# Patient Record
Sex: Female | Born: 1935 | Race: White | Hispanic: No | Marital: Married | Smoking: Former smoker
Health system: Southern US, Community
[De-identification: ages and names within clinical notes are randomized; demographics above are authoritative.]

## PROBLEM LIST (undated history)

## (undated) DIAGNOSIS — M51369 Other intervertebral disc degeneration, lumbar region without mention of lumbar back pain or lower extremity pain: Secondary | ICD-10-CM

## (undated) DIAGNOSIS — Z950 Presence of cardiac pacemaker: Secondary | ICD-10-CM

## (undated) DIAGNOSIS — E785 Hyperlipidemia, unspecified: Secondary | ICD-10-CM

## (undated) DIAGNOSIS — J449 Chronic obstructive pulmonary disease, unspecified: Secondary | ICD-10-CM

## (undated) DIAGNOSIS — I739 Peripheral vascular disease, unspecified: Secondary | ICD-10-CM

## (undated) DIAGNOSIS — I219 Acute myocardial infarction, unspecified: Secondary | ICD-10-CM

## (undated) DIAGNOSIS — M549 Dorsalgia, unspecified: Secondary | ICD-10-CM

## (undated) DIAGNOSIS — M179 Osteoarthritis of knee, unspecified: Secondary | ICD-10-CM

## (undated) DIAGNOSIS — M1990 Unspecified osteoarthritis, unspecified site: Secondary | ICD-10-CM

## (undated) DIAGNOSIS — M5136 Other intervertebral disc degeneration, lumbar region: Secondary | ICD-10-CM

## (undated) DIAGNOSIS — I82409 Acute embolism and thrombosis of unspecified deep veins of unspecified lower extremity: Secondary | ICD-10-CM

## (undated) DIAGNOSIS — D649 Anemia, unspecified: Secondary | ICD-10-CM

## (undated) DIAGNOSIS — K922 Gastrointestinal hemorrhage, unspecified: Secondary | ICD-10-CM

## (undated) DIAGNOSIS — M5126 Other intervertebral disc displacement, lumbar region: Secondary | ICD-10-CM

## (undated) DIAGNOSIS — Z7901 Long term (current) use of anticoagulants: Secondary | ICD-10-CM

## (undated) DIAGNOSIS — M1710 Unilateral primary osteoarthritis, unspecified knee: Secondary | ICD-10-CM

## (undated) DIAGNOSIS — I10 Essential (primary) hypertension: Secondary | ICD-10-CM

## (undated) DIAGNOSIS — G8929 Other chronic pain: Secondary | ICD-10-CM

## (undated) DIAGNOSIS — I495 Sick sinus syndrome: Secondary | ICD-10-CM

## (undated) DIAGNOSIS — I4892 Unspecified atrial flutter: Secondary | ICD-10-CM

## (undated) HISTORY — DX: Gastrointestinal hemorrhage, unspecified: K92.2

## (undated) HISTORY — PX: APPENDECTOMY: SHX54

## (undated) HISTORY — DX: Unspecified atrial flutter: I48.92

## (undated) HISTORY — DX: Chronic obstructive pulmonary disease, unspecified: J44.9

## (undated) HISTORY — DX: Osteoarthritis of knee, unspecified: M17.9

## (undated) HISTORY — DX: Unspecified osteoarthritis, unspecified site: M19.90

## (undated) HISTORY — DX: Other intervertebral disc degeneration, lumbar region: M51.36

## (undated) HISTORY — DX: Essential (primary) hypertension: I10

## (undated) HISTORY — DX: Hyperlipidemia, unspecified: E78.5

## (undated) HISTORY — DX: Acute embolism and thrombosis of unspecified deep veins of unspecified lower extremity: I82.409

## (undated) HISTORY — DX: Peripheral vascular disease, unspecified: I73.9

## (undated) HISTORY — PX: LESION REMOVAL: SHX5196

## (undated) HISTORY — DX: Other chronic pain: G89.29

## (undated) HISTORY — DX: Sick sinus syndrome: I49.5

## (undated) HISTORY — PX: SPLENECTOMY: SUR1306

## (undated) HISTORY — DX: Unilateral primary osteoarthritis, unspecified knee: M17.10

## (undated) HISTORY — DX: Acute myocardial infarction, unspecified: I21.9

## (undated) HISTORY — DX: Other intervertebral disc degeneration, lumbar region without mention of lumbar back pain or lower extremity pain: M51.369

## (undated) HISTORY — DX: Presence of cardiac pacemaker: Z95.0

## (undated) HISTORY — DX: Anemia, unspecified: D64.9

## (undated) HISTORY — DX: Long term (current) use of anticoagulants: Z79.01

## (undated) HISTORY — DX: Dorsalgia, unspecified: M54.9

## (undated) HISTORY — PX: LUMBAR DISC SURGERY: SHX700

## (undated) HISTORY — PX: OTHER SURGICAL HISTORY: SHX169

## (undated) HISTORY — DX: Other intervertebral disc displacement, lumbar region: M51.26

---

## 1989-06-02 HISTORY — PX: NASAL SINUS SURGERY: SHX719

## 1998-03-31 ENCOUNTER — Other Ambulatory Visit: Admission: RE | Admit: 1998-03-31 | Discharge: 1998-03-31 | Payer: Self-pay | Admitting: Family Medicine

## 1999-03-24 ENCOUNTER — Other Ambulatory Visit: Admission: RE | Admit: 1999-03-24 | Discharge: 1999-03-24 | Payer: Self-pay | Admitting: Family Medicine

## 2001-05-15 ENCOUNTER — Other Ambulatory Visit: Admission: RE | Admit: 2001-05-15 | Discharge: 2001-05-15 | Payer: Self-pay | Admitting: Family Medicine

## 2002-03-21 ENCOUNTER — Encounter: Payer: Self-pay | Admitting: Orthopaedic Surgery

## 2002-03-21 ENCOUNTER — Ambulatory Visit (HOSPITAL_COMMUNITY): Admission: RE | Admit: 2002-03-21 | Discharge: 2002-03-21 | Payer: Self-pay | Admitting: Orthopaedic Surgery

## 2002-05-20 ENCOUNTER — Other Ambulatory Visit: Admission: RE | Admit: 2002-05-20 | Discharge: 2002-05-20 | Payer: Self-pay | Admitting: Gynecology

## 2002-10-02 HISTORY — PX: CARDIAC CATHETERIZATION: SHX172

## 2003-06-03 ENCOUNTER — Encounter: Payer: Self-pay | Admitting: Nephrology

## 2003-06-03 ENCOUNTER — Encounter: Admission: RE | Admit: 2003-06-03 | Discharge: 2003-06-03 | Payer: Self-pay | Admitting: Nephrology

## 2003-07-08 ENCOUNTER — Encounter: Payer: Self-pay | Admitting: Emergency Medicine

## 2003-07-08 ENCOUNTER — Inpatient Hospital Stay (HOSPITAL_COMMUNITY): Admission: EM | Admit: 2003-07-08 | Discharge: 2003-07-14 | Payer: Self-pay | Admitting: Emergency Medicine

## 2003-07-09 HISTORY — PX: PACEMAKER INSERTION: SHX728

## 2003-07-11 ENCOUNTER — Encounter: Payer: Self-pay | Admitting: Internal Medicine

## 2003-07-12 ENCOUNTER — Encounter: Payer: Self-pay | Admitting: Internal Medicine

## 2003-07-23 ENCOUNTER — Inpatient Hospital Stay (HOSPITAL_COMMUNITY): Admission: AD | Admit: 2003-07-23 | Discharge: 2003-07-27 | Payer: Self-pay | Admitting: Internal Medicine

## 2003-07-23 ENCOUNTER — Encounter: Payer: Self-pay | Admitting: Internal Medicine

## 2003-07-26 ENCOUNTER — Encounter: Payer: Self-pay | Admitting: Internal Medicine

## 2003-07-31 ENCOUNTER — Inpatient Hospital Stay (HOSPITAL_COMMUNITY): Admission: AD | Admit: 2003-07-31 | Discharge: 2003-08-05 | Payer: Self-pay | Admitting: Family Medicine

## 2003-08-06 ENCOUNTER — Encounter: Admission: RE | Admit: 2003-08-06 | Discharge: 2003-08-06 | Payer: Self-pay | Admitting: Family Medicine

## 2003-08-25 ENCOUNTER — Ambulatory Visit (HOSPITAL_COMMUNITY): Admission: RE | Admit: 2003-08-25 | Discharge: 2003-08-25 | Payer: Self-pay | Admitting: Gastroenterology

## 2003-08-25 ENCOUNTER — Encounter (INDEPENDENT_AMBULATORY_CARE_PROVIDER_SITE_OTHER): Payer: Self-pay | Admitting: *Deleted

## 2004-07-01 ENCOUNTER — Other Ambulatory Visit: Admission: RE | Admit: 2004-07-01 | Discharge: 2004-07-01 | Payer: Self-pay | Admitting: Gynecology

## 2004-09-21 ENCOUNTER — Ambulatory Visit: Payer: Self-pay

## 2004-12-20 ENCOUNTER — Ambulatory Visit: Payer: Self-pay | Admitting: Internal Medicine

## 2005-02-07 ENCOUNTER — Ambulatory Visit: Payer: Self-pay | Admitting: Internal Medicine

## 2005-05-17 ENCOUNTER — Ambulatory Visit: Payer: Self-pay | Admitting: Internal Medicine

## 2005-05-31 ENCOUNTER — Encounter: Admission: RE | Admit: 2005-05-31 | Discharge: 2005-05-31 | Payer: Self-pay | Admitting: Orthopedic Surgery

## 2005-06-13 ENCOUNTER — Ambulatory Visit: Payer: Self-pay | Admitting: Internal Medicine

## 2005-07-04 ENCOUNTER — Encounter: Admission: RE | Admit: 2005-07-04 | Discharge: 2005-07-04 | Payer: Self-pay | Admitting: Orthopedic Surgery

## 2005-08-17 ENCOUNTER — Inpatient Hospital Stay (HOSPITAL_COMMUNITY): Admission: RE | Admit: 2005-08-17 | Discharge: 2005-08-20 | Payer: Self-pay | Admitting: Neurosurgery

## 2005-09-08 ENCOUNTER — Ambulatory Visit: Payer: Self-pay | Admitting: Internal Medicine

## 2005-12-07 ENCOUNTER — Ambulatory Visit: Payer: Self-pay | Admitting: Internal Medicine

## 2005-12-25 ENCOUNTER — Encounter: Admission: RE | Admit: 2005-12-25 | Discharge: 2005-12-25 | Payer: Self-pay | Admitting: Orthopedic Surgery

## 2006-02-22 ENCOUNTER — Encounter: Admission: RE | Admit: 2006-02-22 | Discharge: 2006-03-22 | Payer: Self-pay | Admitting: Neurosurgery

## 2006-03-07 ENCOUNTER — Ambulatory Visit: Payer: Self-pay | Admitting: Internal Medicine

## 2006-05-29 ENCOUNTER — Ambulatory Visit (HOSPITAL_COMMUNITY): Admission: RE | Admit: 2006-05-29 | Discharge: 2006-05-29 | Payer: Self-pay | Admitting: Ophthalmology

## 2006-06-11 ENCOUNTER — Ambulatory Visit: Payer: Self-pay | Admitting: Internal Medicine

## 2006-07-24 ENCOUNTER — Ambulatory Visit: Payer: Self-pay | Admitting: Internal Medicine

## 2006-07-27 ENCOUNTER — Other Ambulatory Visit: Admission: RE | Admit: 2006-07-27 | Discharge: 2006-07-27 | Payer: Self-pay | Admitting: Gynecology

## 2006-09-04 ENCOUNTER — Inpatient Hospital Stay (HOSPITAL_COMMUNITY): Admission: RE | Admit: 2006-09-04 | Discharge: 2006-09-09 | Payer: Self-pay | Admitting: Orthopaedic Surgery

## 2006-09-06 ENCOUNTER — Ambulatory Visit: Payer: Self-pay | Admitting: Hospitalist

## 2006-09-18 ENCOUNTER — Encounter: Payer: Self-pay | Admitting: Vascular Surgery

## 2006-09-18 ENCOUNTER — Ambulatory Visit (HOSPITAL_COMMUNITY): Admission: RE | Admit: 2006-09-18 | Discharge: 2006-09-18 | Payer: Self-pay | Admitting: Orthopedic Surgery

## 2006-11-13 ENCOUNTER — Encounter: Admission: RE | Admit: 2006-11-13 | Discharge: 2006-12-06 | Payer: Self-pay | Admitting: Orthopaedic Surgery

## 2006-12-04 ENCOUNTER — Ambulatory Visit: Payer: Self-pay | Admitting: Internal Medicine

## 2007-06-04 ENCOUNTER — Ambulatory Visit: Payer: Self-pay | Admitting: Internal Medicine

## 2007-11-05 ENCOUNTER — Inpatient Hospital Stay (HOSPITAL_COMMUNITY): Admission: EM | Admit: 2007-11-05 | Discharge: 2007-11-07 | Payer: Self-pay | Admitting: Emergency Medicine

## 2007-12-05 ENCOUNTER — Ambulatory Visit: Payer: Self-pay | Admitting: Internal Medicine

## 2007-12-10 ENCOUNTER — Ambulatory Visit (HOSPITAL_COMMUNITY): Admission: RE | Admit: 2007-12-10 | Discharge: 2007-12-10 | Payer: Self-pay | Admitting: Gastroenterology

## 2008-02-18 ENCOUNTER — Ambulatory Visit: Payer: Self-pay | Admitting: Internal Medicine

## 2008-03-24 ENCOUNTER — Ambulatory Visit (HOSPITAL_COMMUNITY): Admission: RE | Admit: 2008-03-24 | Discharge: 2008-03-24 | Payer: Self-pay | Admitting: Ophthalmology

## 2008-05-20 ENCOUNTER — Ambulatory Visit: Payer: Self-pay | Admitting: Internal Medicine

## 2008-08-21 ENCOUNTER — Ambulatory Visit: Payer: Self-pay | Admitting: Internal Medicine

## 2008-11-02 ENCOUNTER — Encounter (INDEPENDENT_AMBULATORY_CARE_PROVIDER_SITE_OTHER): Payer: Self-pay | Admitting: *Deleted

## 2009-01-28 ENCOUNTER — Encounter: Payer: Self-pay | Admitting: Internal Medicine

## 2009-02-09 ENCOUNTER — Ambulatory Visit: Payer: Self-pay | Admitting: Internal Medicine

## 2009-02-09 DIAGNOSIS — I495 Sick sinus syndrome: Secondary | ICD-10-CM | POA: Insufficient documentation

## 2009-02-09 DIAGNOSIS — Z950 Presence of cardiac pacemaker: Secondary | ICD-10-CM | POA: Insufficient documentation

## 2009-02-09 DIAGNOSIS — I10 Essential (primary) hypertension: Secondary | ICD-10-CM | POA: Insufficient documentation

## 2009-02-09 DIAGNOSIS — M549 Dorsalgia, unspecified: Secondary | ICD-10-CM | POA: Insufficient documentation

## 2009-02-09 DIAGNOSIS — I4892 Unspecified atrial flutter: Secondary | ICD-10-CM

## 2009-02-09 DIAGNOSIS — I4891 Unspecified atrial fibrillation: Secondary | ICD-10-CM | POA: Insufficient documentation

## 2009-03-05 ENCOUNTER — Ambulatory Visit (HOSPITAL_COMMUNITY): Admission: RE | Admit: 2009-03-05 | Discharge: 2009-03-05 | Payer: Self-pay | Admitting: Neurosurgery

## 2009-04-23 ENCOUNTER — Encounter: Payer: Self-pay | Admitting: Internal Medicine

## 2009-05-12 ENCOUNTER — Encounter: Payer: Self-pay | Admitting: Internal Medicine

## 2009-05-12 ENCOUNTER — Ambulatory Visit: Payer: Self-pay | Admitting: Internal Medicine

## 2009-05-18 ENCOUNTER — Encounter: Payer: Self-pay | Admitting: Internal Medicine

## 2009-07-27 ENCOUNTER — Encounter: Admission: RE | Admit: 2009-07-27 | Discharge: 2009-07-27 | Payer: Self-pay | Admitting: Orthopaedic Surgery

## 2009-07-30 ENCOUNTER — Encounter: Admission: RE | Admit: 2009-07-30 | Discharge: 2009-07-30 | Payer: Self-pay | Admitting: Orthopaedic Surgery

## 2009-08-05 ENCOUNTER — Encounter: Admission: RE | Admit: 2009-08-05 | Discharge: 2009-09-30 | Payer: Self-pay | Admitting: Orthopaedic Surgery

## 2009-08-11 ENCOUNTER — Ambulatory Visit: Payer: Self-pay | Admitting: Internal Medicine

## 2009-08-12 ENCOUNTER — Encounter: Payer: Self-pay | Admitting: Internal Medicine

## 2009-08-19 ENCOUNTER — Encounter: Payer: Self-pay | Admitting: Internal Medicine

## 2009-10-10 ENCOUNTER — Encounter: Payer: Self-pay | Admitting: Internal Medicine

## 2009-11-11 ENCOUNTER — Ambulatory Visit: Payer: Self-pay | Admitting: Internal Medicine

## 2009-11-26 ENCOUNTER — Encounter: Payer: Self-pay | Admitting: Internal Medicine

## 2010-02-09 ENCOUNTER — Ambulatory Visit: Payer: Self-pay | Admitting: Internal Medicine

## 2010-04-06 ENCOUNTER — Encounter: Payer: Self-pay | Admitting: Internal Medicine

## 2010-04-13 ENCOUNTER — Ambulatory Visit (HOSPITAL_COMMUNITY): Admission: RE | Admit: 2010-04-13 | Discharge: 2010-04-13 | Payer: Self-pay | Admitting: Neurosurgery

## 2010-04-13 ENCOUNTER — Encounter: Payer: Self-pay | Admitting: Internal Medicine

## 2010-05-12 ENCOUNTER — Ambulatory Visit: Payer: Self-pay | Admitting: Internal Medicine

## 2010-06-15 ENCOUNTER — Encounter: Payer: Self-pay | Admitting: Internal Medicine

## 2010-06-21 ENCOUNTER — Encounter: Payer: Self-pay | Admitting: Internal Medicine

## 2010-08-02 ENCOUNTER — Encounter: Admission: RE | Admit: 2010-08-02 | Discharge: 2010-08-02 | Payer: Self-pay | Admitting: Neurosurgery

## 2010-08-09 ENCOUNTER — Encounter: Payer: Self-pay | Admitting: Internal Medicine

## 2010-08-11 ENCOUNTER — Ambulatory Visit: Payer: Self-pay | Admitting: Internal Medicine

## 2010-08-24 ENCOUNTER — Encounter: Payer: Self-pay | Admitting: Internal Medicine

## 2010-10-18 ENCOUNTER — Encounter
Admission: RE | Admit: 2010-10-18 | Discharge: 2010-10-18 | Payer: Self-pay | Source: Home / Self Care | Attending: Neurosurgery | Admitting: Neurosurgery

## 2010-10-28 ENCOUNTER — Encounter: Payer: Self-pay | Admitting: Internal Medicine

## 2010-11-01 NOTE — Cardiovascular Report (Signed)
Summary: Office Visit Remote   Office Visit Remote   Imported By: Roderic Ovens 06/21/2010 13:58:28  _____________________________________________________________________  External Attachment:    Type:   Image     Comment:   External Document

## 2010-11-01 NOTE — Letter (Signed)
Summary: Remote Device Check  [employer], Main Office  [address]   [place], [place] [address]   Phone: [phone]  Fax: [phone]     August 24, 2010 MRN: [id number]   [name] [address] [place], [place]  [address]   Dear [name],   Your remote transmission was recieved and reviewed by your physician.  All diagnostics were within normal limits for you.  __X___Your next transmission is scheduled for:   11-10-10.  Please transmit at any time this day.  If you have a wireless device your transmission will be sent automatically.   Sincerely,  Vella Kohler

## 2010-11-01 NOTE — Cardiovascular Report (Signed)
Summary: Office Visit Remote   Office Visit Remote   Imported By: Roderic Ovens 11/29/2009 10:59:28  _____________________________________________________________________  External Attachment:    Type:   Image     Comment:   External Document

## 2010-11-01 NOTE — Assessment & Plan Note (Signed)
Summary: 1 YR F/U [employer]   Visit Type:  Follow-up Primary Provider:  Rudi Heap, MD   History of Present Illness: [name] returns today for followup.  She is a pleasant woman with PAF, atrial flutter s/p ablation, symptomatic bradycardia, s/p PPM and CAD.  She has had a myelogram and spinal injections.  She denies c/p and sob.  She has rare palpitations.  She notes that her blood pressure is well controlled most of the time.  Current Medications (verified): 1)  Lisinopril 20 Mg Tabs (Lisinopril) .Marland Kitchen.. 1 Tab Once Daily 2)  Verapamil Hcl Cr 240 Mg Cr-Tabs (Verapamil Hcl) .Marland Kitchen.. 1 Tab in Am..1/2 Tab At Bedtime 3)  Crestor 10 Mg Tabs (Rosuvastatin Calcium) .Marland Kitchen.. 1 Tab Once Daily 4)  Zetia 10 Mg Tabs (Ezetimibe) .Marland Kitchen.. 1 Tab Once Daily 5)  Coumadin 3 Mg Tabs (Warfarin Sodium) .... Take As Directed 6)  Calcium 600/vitamin D 600-400 Mg-Unit Tabs (Calcium Carbonate-Vitamin D) .... 2 Tab Once Daily 7)  Multivitamins   Tabs (Multiple Vitamin) .Marland Kitchen.. 1 Tab Once Daily 8)  Fish Oil 1000 Mg Caps (Omega-3 Fatty Acids) .... 2 Caps Daily 9)  Vitamin C 500 Mg Tabs (Ascorbic Acid) .Marland Kitchen.. 1 Tab Once Daily 10)  Aspirin 81 Mg Tbec (Aspirin) .... Take One Tablet By Mouth Daily 11)  Vitamin D 1000 Unit Tabs (Cholecalciferol) .... 2 Tabs Daily 12)  Vitamin B-12 250 Mcg Tabs (Cyanocobalamin) .... Once Daily 13)  Bystolic 5 Mg Tabs (Nebivolol Hcl) .... Take One Tablet By Mouth Once Daily. 14)  Furosemide 20 Mg Tabs (Furosemide) .... Take One Tablet As Needed 15)  Nexium 40 Mg Cpdr (Esomeprazole Magnesium) .Marland Kitchen.. 1 Capsule Every Other Day 16)  Tramadol Hcl 50 Mg Tabs (Tramadol Hcl) .... Uad As Needed  Allergies (verified): No Known Drug Allergies  Past History:  Past Medical History: Last updated: 02/09/2009 ATRIAL FLUTTER, PAROXYSMAL (ICD-427.32) BRADYCARDIA-TACHYCARDIA SYNDROME (ICD-427.81) HYPERTENSION, UNSPECIFIED (ICD-401.9)    Past Surgical History: Last updated: 02/09/2009 Small bowel capsule  endoscopy.  12/10/2007  Colonoscopy.  11/07/2007  Esophagogastroduodenoscopy.  11/06/2007  Hip Arthroplasty-Total.. 09/04/2006 1.  L2 laminectomy. 08/17/2005 2.  L2-3 posterior lumbar interbody fusion with AVS PEEK interbody implants      with VITOSS with bone marrow aspirate and L2-3 posterolateral      arthrodesis with 90D posterior instrumentation and VITOSS with bone      graft with microdissection. Esophagogastroduodenoscopy and biopsy. 08/25/2003  Review of Systems  The patient denies chest pain, syncope, dyspnea on exertion, and peripheral edema.    Vital Signs:  Patient profile:   77 year old female Height:      66 inches Weight:      168 pounds BMI:     27.21 Pulse rate:   77 / minute BP sitting:   150 / 90  (left arm)  Vitals Entered By: [name] CMA (Feb 09, 2010 9:47 AM)  Physical Exam  General:  Well developed, well nourished, in no acute distress. Head:  normocephalic and atraumatic Eyes:  PERRLA/EOM intact; conjunctiva and lids normal. Mouth:  Teeth, gums and palate normal. Oral mucosa normal. Neck:  Neck supple, no JVD. No masses, thyromegaly or abnormal cervical nodes. Chest Wall:  no deformities or breast masses noted.  Well healed PPM incision. Lungs:  Clear bilaterally to auscultation with no wheezes or rhonchi. Heart:  RRR with out murmur, rub, or gallop.  PMI is normal. Abdomen:  Bowel sounds positive; abdomen soft and non-tender without masses, organomegaly, or hernias noted. No  hepatosplenomegaly. Msk:  Back normal, normal gait. Muscle strength and tone normal. Pulses:  pulses normal in all 4 extremities Extremities:  No clubbing or cyanosis. Neurologic:  Alert and oriented x 3.   PPM Specifications Following MD:  Lewayne Bunting, MD     PPM Vendor:  [employer]     PPM Model Number:  [id number]     PPM Serial Number:  [id number] PPM DOI:  07/09/2003      Lead 1    Location: RA     DOI: 07/09/2003     Model #: [id number]     Serial #: [id number]     Status:  active Lead 2    Location: RV     DOI: 07/09/2003     Model #: [id number]     Serial #: [id number]     Status: active  Magnet Response Rate:  BOL 85 ERI  65   Explantation Comments:  Coumadin [facility]link  PPM Follow Up Remote Check?  No Battery Voltage:  2.72 V     Battery Est. Longevity:  2 years     Pacer Dependent:  Yes       PPM Device Measurements Atrium  Impedance: 499 ohms, Threshold: 0.5 V at 0.4 msec Right Ventricle  Amplitude: 11.20 mV, Impedance: 576 ohms, Threshold: 0.5 V at 0.4 msec  Episodes MS Episodes:  4383     Percent Mode Switch:  3.4%     Coumadin:  Yes Ventricular High Rate:  1     Atrial Pacing:  96.2%     Ventricular Pacing:  7.8%  Parameters Mode:  DDDR     Lower Rate Limit:  70     Upper Rate Limit:  125 Paced AV Delay:  250     Sensed AV Delay:  140 Next Remote Date:  05/12/2010     Next Cardiology Appt Due:  01/31/2011 Tech Comments:  No parameter changes.  Longest mode switch 4:55 hours, + coumadin, ventricular rates controlled for the most part.  [facility]link transmissions every 3 months.  ROV 1 year with Dr. [name]. Valerie Harm, LPN  Feb 09, 2010 10:03 AM  MD Comments:  Agree with above.  Impression & Recommendations:  Problem # 1:  ATRIAL FIBRILLATION (ICD-427.31) Her rhythm remains stable.  Continue current meds. Her updated medication list for this problem includes:    Coumadin 3 Mg Tabs (Warfarin sodium) .Marland Kitchen... Take as directed    Aspirin 81 Mg Tbec (Aspirin) .Marland Kitchen... Take one tablet by mouth daily    Bystolic 5 Mg Tabs (Nebivolol hcl) .Marland Kitchen... Take one tablet by mouth once daily.  Problem # 2:  CARDIAC PACEMAKER IN SITU (ICD-V45.01) Her device is working normally.  She is approaching ERI.  Will see her back in one year.  Problem # 3:  HYPERTENSION, UNSPECIFIED (ICD-401.9) Her blood pressure is elevated. She will continue her current meds and a low sodium diet.  She notes that at home her blood pressure is better controlled. Her updated medication list for  this problem includes:    Lisinopril 20 Mg Tabs (Lisinopril) .Marland Kitchen... 1 tab once daily    Verapamil Hcl Cr 240 Mg Cr-tabs (Verapamil hcl) .Marland Kitchen... 1 tab in am..1/2 tab at bedtime    Aspirin 81 Mg Tbec (Aspirin) .Marland Kitchen... Take one tablet by mouth daily    Bystolic 5 Mg Tabs (Nebivolol hcl) .Marland Kitchen... Take one tablet by mouth once daily.    Furosemide 20 Mg Tabs (Furosemide) .Marland Kitchen... Take one tablet as needed  Patient Instructions:  1)  Your physician recommends that you schedule a follow-up appointment in: 12 months with Dr [name]

## 2010-11-01 NOTE — Letter (Signed)
Summary: Remote Device Check  [employer], Main Office  [address]   [place], [place] [address]   Phone: [phone]  Fax: [phone]     November 26, 2009 MRN: [id number]   JENELLE DRENNON [address] [place], [place]  [address]   Dear Ms. [facility],   Your remote transmission was recieved and reviewed by your physician.  All diagnostics were within normal limits for you.    __X____Your next office visit is scheduled for:  MAY 2011 WITH DR [name]. Please call our office to schedule an appointment.    Sincerely,  Proofreader

## 2010-11-01 NOTE — Letter (Signed)
Summary: [facility]  [facility]   Imported By: Marylou Mccoy 05/17/2010 10:19:28  _____________________________________________________________________  External Attachment:    Type:   Image     Comment:   External Document

## 2010-11-01 NOTE — Letter (Signed)
Summary: [facility]  [facility]   Imported By: Marylou Mccoy 09/02/2010 14:33:26  _____________________________________________________________________  External Attachment:    Type:   Image     Comment:   External Document

## 2010-11-01 NOTE — Letter (Signed)
Summary: Remote Device Check  [employer], Main Office  [address]   [place], [place] [address]   Phone: [phone]  Fax: [phone]     June 15, 2010 MRN: [id number]   [name] [address] [place], [place]  [address]   Dear Ms. [facility],   Your remote transmission was recieved and reviewed by your physician.  All diagnostics were within normal limits for you.  __X___Your next transmission is scheduled for:   08-11-10.  Please transmit at any time this day.  If you have a wireless device your transmission will be sent automatically.   Sincerely,  Vella Kohler

## 2010-11-01 NOTE — Cardiovascular Report (Signed)
Summary: Office Visit Remote   Office Visit Remote   Imported By: Roderic Ovens 09/05/2010 14:50:59  _____________________________________________________________________  External Attachment:    Type:   Image     Comment:   External Document

## 2010-11-01 NOTE — Letter (Signed)
Summary: [facility] Office Visit Note   [facility] Office Visit Note   Imported By: Roderic Ovens 05/13/2010 14:44:38  _____________________________________________________________________  External Attachment:    Type:   Image     Comment:   External Document

## 2010-11-03 NOTE — Letter (Signed)
Summary: [facility]   [facility]   Imported By: Roderic Ovens 10/06/2010 15:42:05  _____________________________________________________________________  External Attachment:    Type:   Image     Comment:   External Document

## 2010-11-10 ENCOUNTER — Encounter (INDEPENDENT_AMBULATORY_CARE_PROVIDER_SITE_OTHER): Payer: Medicare Other

## 2010-11-10 ENCOUNTER — Encounter: Payer: Self-pay | Admitting: Internal Medicine

## 2010-11-10 DIAGNOSIS — I495 Sick sinus syndrome: Secondary | ICD-10-CM

## 2010-11-29 NOTE — Cardiovascular Report (Signed)
Summary: Office Visit   Office Visit   Imported By: Roderic Ovens 11/23/2010 15:49:12  _____________________________________________________________________  External Attachment:    Type:   Image     Comment:   External Document

## 2010-12-13 NOTE — Letter (Signed)
Summary: [facility] Office Visit Note   [facility] Office Visit Note   Imported By: Roderic Ovens 12/07/2010 11:21:32  _____________________________________________________________________  External Attachment:    Type:   Image     Comment:   External Document

## 2010-12-18 LAB — PROTIME-INR
INR: 1.09 (ref 0.00–1.49)
Prothrombin Time: 14 seconds (ref 11.6–15.2)

## 2011-02-13 ENCOUNTER — Encounter: Payer: Self-pay | Admitting: Internal Medicine

## 2011-02-13 ENCOUNTER — Encounter: Payer: Self-pay | Admitting: *Deleted
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
## 2011-02-14 NOTE — Assessment & Plan Note (Signed)
[facility]                         ELECTROPHYSIOLOGY OFFICE NOTE   [name], [name]                     MRN:          [id number]  DATE:02/18/2008                            DOB:          [birth year]    [name] returns today for followup.  She is a very pleasant 77-year-  old woman with a history of symptomatic tachy-brady syndrome who had had  atrial flutter with 1: 1 AV conduction in the past, and underwent  catheter ablation.  She also had severe bradycardia, and underwent  pacemaker insertion.  She returns, today, for followup.  She has been  stable, but recently tripped while she was walking around outside and  fell and landed on her arm.  There are no broken bones, but she has very  large hematoma on the lateral aspect of left lower arm.  There is a  resulting ecchymotic area throughout the left arm.  The patient had no  specific complaints.  She denies fevers or chills.  She denies any loss  of range of motion in her hand or arm.   PHYSICAL EXAMINATION:  On physical exam the patient is a pleasant 77-  year-old woman in no acute distress.  The blood pressure was 140/80, the pulse was 82 and regular,  respirations were 18.  Weight was 164 pounds  NECK:  Revealed no jugular distention.  LUNGS:  Clear bilaterally to auscultation.  No wheezes, rales, or  rhonchi are present.  CARDIOVASCULAR EXAM:  Revealed a regular rate and rhythm with a normal  S1 and S2.  EXTREMITIES:  Demonstrated no edema.  The left forearm has a 3 x 8 cm  ecchymotic area.  There is no drainage.   INTERROGATION OF THE PACEMAKER DEMONSTRATES:  A Medtronic Kappa 900.  P  and R waves were 2 and 11 respectively.  The impedance 500 in the A and  600 in the V.  The threshold 0.5 at 0.4 in the right atrium, and 0.75 at  0.4 in the right ventricle.  The battery voltage was 2.76 volts.  There  was 119 mode switches less than 0.1% of the time.  The longest of which  was  approximately 30 minutes.  She was 99% A-paced and 1% V-paced.   IMPRESSION:  1. Symptomatic tachy-brady syndrome.  2. Status post pacemaker insertion.  3. Paroxysmal atrial flutter status post ablation.   DISCUSSION:  Overall [name] is stable.  She did have a recent  fall, but appears to be on the healing side of things.  I will plan to  see her back in the office in 1 year, sooner should she have other  problems.  I did tell her that if her falls continue we will have to  stop her Coumadin.  This is one isolated event.     [name]. [name], MD  Electronically Signed    GWT/MedQ  DD: 02/18/2008  DT: 02/18/2008  Job #: [id number]
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
## 2011-02-14 NOTE — Assessment & Plan Note (Signed)
[facility]                         ELECTROPHYSIOLOGY OFFICE NOTE   CATHYE, [name]                     MRN:          [id number]  DATE:06/04/2007                            DOB:          [birth year]    [name] returns today for followup.  She is a very pleasant woman  with a history of symptomatic bradycardia and tachybrady syndrome who is  status post pacemaker insertion.  She denies chest pain.  She denies  shortness of breath.  Overall, she feels well.  She notes that she had  had hip surgery back in December with a hip replacement and has done  well with this.   EXAM:  .  She is a pleasant, well-appearing woman in no distress.  Blood pressure 114/76, the pulse 88 and regular.  Respirations were 18,  and the weight was 165 pounds.  NECK:  No jugular venous distention.  LUNGS:  Clear bilaterally to auscultation.  No wheezes, rales, or  rhonchi were present.  CARDIOVASCULAR:  Regular rate and rhythm with normal S1 and S2.  EXTREMITIES:  No edema.   Interrogation of her pacemaker, Kappa 900, with P and R waves of 2 and  11, respectively.  The impedance 492 in the atrium, 562 in the ventricle  with threshold of 0.5 and 0.4 in both the right atrium and the right  ventricle.  Battery voltage 2.77 V.  She had 347 mode switches, the  longest of which was 2 hours.  Her lower rate was set at 70.  She was  97% A-paced, less than 3% V-paced.   IMPRESSION:  1. Symptomatic bradycardia.  2. Paroxysmal atrial fibrillation.  3. Coumadin therapy.  4. Status post pacemaker.   DISCUSSION:  Overall, [name] is stable.  Her pacemaker is working  normally, and we will plan to see her back for a pacemaker check in a  year.  She will continue on her present medical therapy which includes  Fosamax, Nexium, Coumadin, aspirin, Verapamil, Crestor, Lisinopril, and  multiple vitamins.     [name]. [name], MD  Electronically Signed    GWT/MedQ   DD: 06/04/2007  DT: 06/04/2007  Job #: [id number]   cc:   Lindaann Pascal

## 2011-02-14 NOTE — Op Note (Signed)
NAMEELORA, WOLTER NO.:  [id number]   MEDICAL RECORD NO.:  [id number]          PATIENT TYPE:  INP   LOCATION:  5511                         FACILITY:  [facility]   PHYSICIAN:  James L. Malon Kindle., M.D.DATE OF BIRTH:  November 29, 1930   DATE OF PROCEDURE:  11/06/2007  DATE OF DISCHARGE:                               OPERATIVE REPORT   PROCEDURE PERFORMED:  Esophagogastroduodenoscopy.   MEDICATIONS:  Cetacaine spray, fentanyl 50 mcg, Versed 6 mg IV.   ENDOSCOPIST:  Llana Aliment. Randa Evens, M.D.   INDICATIONS FOR PROCEDURE:  The patient had known history of coronary  disease and atrial fibrillation, has been on Coumadin, had endoscopic  evaluation five years ago in 2004. Her  INR was elevated.  She came in  with positive stool and a hemoglobin of 7 without gross bleeding.   DESCRIPTION OF PROCEDURE:  The procedure had been explained to the  patient and consent obtained.  With the patient in left lateral  decubitus position, the Pentax upper scope was inserted and advanced.  The stomach was entered.  The pylorus identified and passed.  The  duodenum including the bulb and second portion were seen well and were  unremarkable.  The pyloric channel, antrum and body of the stomach were  seen well and were all fine with no abnormalities.  The patient did have  a small hiatal hernia.  The distal and proximal esophagus were  endoscopically normal.  In short there were no signs of active or recent  upper GI bleeding.  The patient tolerated the procedure well.   ASSESSMENT:  Gastrointestinal bleeding with no upper gastrointestinal  source found.   PLAN:  It has been five years since patient had a colonoscopy and will  discuss with her proceeding with a colonoscopy tomorrow.           ______________________________  Llana Aliment Malon Kindle., M.D.     Waldron Session  D:  11/06/2007  T:  11/07/2007  Job:  [id number]   cc:   Ernestina Penna, M.D.  [name]. [name], MD

## 2011-02-14 NOTE — Op Note (Signed)
NAMEWYNNE, ROZAK NO.:  [id number]   MEDICAL RECORD NO.:  [id number]          PATIENT TYPE:  INP   LOCATION:  5511                         FACILITY:  [facility]   PHYSICIAN:  James L. Malon Kindle., M.D.DATE OF BIRTH:  [birth year]   DATE OF PROCEDURE:  11/07/2007  DATE OF DISCHARGE:                               OPERATIVE REPORT   PROCEDURE:  Colonoscopy.   MEDICATIONS:  Fentanyl 100 mcg and Versed 10 mg IV.   INDICATIONS:  Subacute GI bleed with a drop of hemoglobin in a woman who  is on Coumadin and her protime was markedly elevated. Upper endoscopy  was negative.   DESCRIPTION OF PROCEDURE:  The procedure was explained to the patient  and consent obtained.  With the patient in the left lateral decubitus  position, the pediatric adjustable scope was inserted and advanced.  The  prep was excellent.  We reached the cecum without difficulty.  There  were no signs of active bleeding, whatsoever.  The ileocecal valve and  appendiceal orifice were seen.  The scope was withdrawn and the cecum,  ascending colon, transverse colon, descending and sigmoid colon were  seen well.  A few scattered tics in the descending colon, very small.  There was no coffee ground material, clots, or any other abnormalities.  The rectum was seen well on the forward and retroflex view and was  normal.  The scope was withdrawn.  The patient tolerated the procedure  well.   ASSESSMENT:  GI bleeding with no clear source seen.   PLAN:  Would resume Coumadin at the regular dose, have her follow up  with Dr. Kathi Der office up in [place].  I would like to see her back in  the office in three weeks.  We will recheck her stool.  If it is  positive, will consider a capsule endoscopy.           ______________________________  Llana Aliment. Malon Kindle., M.D.     Waldron Session  D:  11/07/2007  T:  11/07/2007  Job:  [id number]   cc:   Ernestina Penna, M.D.

## 2011-02-14 NOTE — Op Note (Signed)
NAMESKILA, ROLLINS NO.:  [id number]   MEDICAL RECORD NO.:  [id number]          PATIENT TYPE:  AMB   LOCATION:  ENDO                         FAC[facility]   PHYSICIAN:  James L. Malon Kindle., M.D.DATE OF BIRTH:  September 24, 1931   DATE OF PROCEDURE:  12/10/2007  DATE OF DISCHARGE:  12/10/2007                               OPERATIVE REPORT   PROCEDURE:  Small bowel capsule endoscopy.   INDICATIONS FOR PROCEDURE:  The patient has had a hemoglobin of 7, INR  has gone up to 4.  She had a colonoscopy showing diverticuli and had no  further bleeding.  Upper endoscopy was normal.  She is chronically on  Coumadin due to atrial fibrillation.  History of DVTs.  This is done to  rule out a lesion of the small bowel.   DESCRIPTION OF PROCEDURE:  The patient was given a Givens capsule in the  usual manner.   RESULTS:  The capsule passed in six hours.  The small bowel passage time  was 2 hours 20 minutes.  The following results were obtained.   The small bowel was essentially free of lesions.  We did not see any  obvious bleeding sites in the small bowel.  Examination of the colon did  reveal a very small AVM in the colon.  The location in the colon was  indeterminate.  Other AVMs could have been present in the colon, but due  to the stool were not seen.   ASSESSMENT:  1. No gross lesions in the small bowel.  2. 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   PLAN:  We will follow the patient clinically and continue to support her  with blood and iron, etc.           ______________________________  Llana Aliment. Malon Kindle., M.D.     Waldron Session  D:  12/19/2007  T:  12/19/2007  Job:  [id number]   cc:   Ernestina Penna, M.D.  Fax: [phone]   [name]. [name], MD  [address]  [address]  [place]  [place] [address]

## 2011-02-14 NOTE — H&P (Signed)
[name], ROW NO.:  [id number]   MEDICAL RECORD NO.:  [id number]          PATIENT TYPE:  INP   LOCATION:  5511                         FACILITY:  [facility]   PHYSICIAN:  Della Goo, M.D. DATE OF BIRTH:  16-Jul-1931   DATE OF ADMISSION:  11/05/2007  DATE OF DISCHARGE:                              HISTORY & PHYSICAL   This is an unassigned patient.   CHIEF COMPLAINT:  Shortness of breath, fatigue.   HISTORY OF PRESENT ILLNESS:  This is a 77 year old female who presents  to the emergency department after being seen by her primary care  physician where she presented secondary to complaints of shortness of  breath and fatigue for 1 month.  She also was told by her husband that  she has had increasing pallor, and he advised her to go to her primary  care physician because she probably was anemic.  The patient saw her  primary care physician in the afternoon prior to admission and was  evaluated and found to have an elevated Coumadin level at 4.0.  She was  also found to be heme-positive on examination and was advised to go to  the emergency department.  Before leaving, the patient was given and a  half a tablet of vitamin K at her physician's office orally.  The  patient came to [facility] because she sees Dr. Randa Evens,  gastroenterology, and has seen him in the past for similar problems.  They arrived in the emergency department.   PAST MEDICAL HISTORY:  1. Coronary artery disease.  2. Pacemaker.  3. Paroxysmal atrial fibrillation.  4. Hyperlipidemia.  5. Prior PTCA with two stents placed.  6. History of left hip replacement.  7. History of two previous back surgeries which were disk fusions.  8. The patient denies have seen any gross hematochezia or melena in      her stools, and she denies having any nausea, vomiting. or      diarrhea.   MEDICATIONS AT THIS TIME:  Celexa, Coumadin, verapamil, Crestor,  Fosamax, lisinopril, multivitamin, Nexium,  Niaspan, and Darvocet-N 100.   SOCIAL HISTORY:  The patient is married, nonsmoker, nondrinker.   FAMILY HISTORY:  Noncontributory.   PHYSICAL EXAMINATION:  GENERAL:  This is a pleasant 77 year old well-  nourished, well-developed female in no discomfort or acute distress.  VITAL SIGNS:  Temperature is 97.9, blood pressure 146/79, heart rate 76,  respirations 20, O2 saturations 100%.  HEENT:  Normocephalic, atraumatic.  Skin does have pallor, so do her  conjunctivae.  Pupils are equally round and reactive to light.  Extraocular muscles are intact.  There is no scleral icterus.  Oropharynx is clear.  NECK:  Supple, full range of motion.  No thyromegaly, adenopathy, or  jugular venous distention.  CARDIOVASCULAR:  Regular rate and rhythm.  No murmurs, gallops, or rubs.  LUNGS:  Clear to auscultation bilaterally.  ABDOMEN:  Positive bowel sounds, soft, nontender, nondistended.  EXTREMITIES:  Without cyanosis, clubbing, or edema.  NEUROLOGIC:  Nonfocal.   LABORATORY STUDIES:  White blood cell count 9.1, hemoglobin 7.9,  hematocrit 25.6, platelets 373, neutrophils 43%  lymphocytes 45%, MCV  74.6.  Pro time is 33.6 and INR is 3.2.  Sodium is 139, potassium 4.5,  chloride 105, carbon dioxide 25, BUN 16, creatinine 1.20, and glucose  104.   ASSESSMENT:  57. A 77 year old female being admitted with occult rectal bleeding      which is chronic versus acute.  2. Anemia.  3. Coagulopathy secondary to Coumadin therapy.  4. Coronary artery disease.  5. Fatigue.   PLAN:  The patient will be admitted to a telemetry area for monitoring.  The patient will be transfused packed red blood cells, and 1 unit of FFP  will be given.  She will be placed on H&H checks and bowel rest with  clear liquids.  IV fluids have also been ordered for maintenance and  hydration.  The patient will continue on her regular medications, except  the Celebrex, aspirin, multivitamin, and B6 will be held at this time.  IV  Protonix therapy has been ordered, and GI prophylaxis of TED hose has  been ordered.  Gastroenterology will be consulted.      Della Goo, M.D.  Electronically Signed     HJ/MEDQ  D:  11/06/2007  T:  11/07/2007  Job:  [id number]

## 2011-02-16 ENCOUNTER — Encounter: Payer: Self-pay | Admitting: Family Medicine

## 2011-02-17 ENCOUNTER — Ambulatory Visit (INDEPENDENT_AMBULATORY_CARE_PROVIDER_SITE_OTHER): Payer: Medicare Other | Admitting: Internal Medicine

## 2011-02-17 ENCOUNTER — Encounter: Payer: Self-pay | Admitting: Internal Medicine

## 2011-02-17 DIAGNOSIS — I4892 Unspecified atrial flutter: Secondary | ICD-10-CM

## 2011-02-17 DIAGNOSIS — I10 Essential (primary) hypertension: Secondary | ICD-10-CM

## 2011-02-17 DIAGNOSIS — I4891 Unspecified atrial fibrillation: Secondary | ICD-10-CM

## 2011-02-17 DIAGNOSIS — Z950 Presence of cardiac pacemaker: Secondary | ICD-10-CM

## 2011-02-17 DIAGNOSIS — I459 Conduction disorder, unspecified: Secondary | ICD-10-CM

## 2011-02-17 NOTE — Discharge Summary (Signed)
NAMEWESSIE, SHANKS NO.:  [id number]   MEDICAL RECORD NO.:  [id number]            PATIENT TYPE:   LOCATION:                                 FACILITY:   PHYSICIAN:  Claude Manges. Cleophas Dunker, M.D.    DATE OF BIRTH:   DATE OF ADMISSION:  09/04/2006  DATE OF DISCHARGE:  09/09/2006                               DISCHARGE SUMMARY   ADMISSION DIAGNOSES:  1. End-stage osteoarthritis left hip.  2. Coronary artery disease with history of stent placement.  3. Hypertension.  4. Pacemaker.  5. On chronic Coumadin due to atrial fibrillation.  6. History of upper extremity deep vein thrombosis.  7. History of myocardial infarction.   DISCHARGE DIAGNOSES:  1. End-stage osteoarthritis left hip status post left total hip      arthroplasty.  2. Acute blood loss anemia secondary to surgery.  3. Urinary tract infection with resulting leukocytosis. Leukocytosis      now resolved.  4. Hypotension now resolved.  5. Acute renal insufficiency now resolved.  6. Hematoma left hip.  7. Coronary artery disease with history of stent placement.  8. Chronic Coumadin secondary to atrial fibrillation.  9. Hypertension.  10.Pacemaker.  11.History of upper extremity deep venous thrombosis.  12.History of myocardial infarction.   SURGICAL PROCEDURES:  On September 04, 2006 Ms. Canupp underwent a left  total hip arthroplasty by Dr. Claude Manges.  [place] assisted by Dr. Rinaldo Ratel and Jacqualine Code, P.A.-C.   COMPLICATIONS:  None.   She had a Pinnacle 100 series acetabular cup size 52 mm with a Pinnacle  marathon acetabular liner lipped 52 mm outer diameter, 32 mm inner  diameter. A prior GE hip stem with reduction groove 15 mm diameter large  stature left with an apex hole eliminator and Articul/EZE femoral head  32 mm, +5 neck length, 12/14 tone.   CONSULTANTS:  1. Pharmacy consult for Coumadin therapy September 04, 2006.  2. Case management and internal medicine teaching service B  consult      September 06, 2006.  3. Physical therapy consult.   HISTORY OF PRESENT ILLNESS:  This 77 year old white female patient  presented to Dr. Cleophas Dunker with history of left hip pain for the last  year. Pain is present with any movement or ambulation, and it does seem  to be located in the groin. Awakens her at night. The hip seems to give  way at times and has caused her to fall.  X-rays show end-stage  arthritic changes, and she has failed conservative treatment. Because of  this she is presenting for a left hip replacement.   HOSPITAL COURSE:  Ms. Tantillo tolerated her surgical procedure well  without immediate postoperative complications. She was transferred to  5000. Postop day #1 T max was 99, pulse 74, BP 107/59.  Hemoglobin 10,  hematocrit 30. Leg was neurovascularly intact. She had been given 2  units of blood intraoperatively due to low H&H, and that seemed to be  fine at this moment. She was started on therapy per protocol. She did  have difficulty with blood pressure during the  night, and the next day  BP was 122/62, pulse 70. Hemoglobin was 9.3, white count elevated at  12.5. Creatinine slightly elevated at 1.6. Urinalysis preoperatively  showed strep achalasia, and she was started on ampicillin for that. She  was noted to have a hematoma in the area of the left hip, and that was  monitored. Because of her hypotension during the night and poor urinary  output, an internal medicine teaching service B consult was obtained and  they followed her the rest of her hospitalization.   Postop day #3 she was feeling better. BP had improved to 111/63.  Hemoglobin 10.1, hematocrit 32.2. Creatinine was stable. She had a  little bit of vascular congestion on chest x-ray. Urine output had  improved. Lovenox was DC'd, and a K pad was applied to that left hip due  to the hematoma and she was continued on therapy.   Postop day #4, December 8, she is doing well. Her renal function  had  improved back to baseline. Urine output was good. BP had improved to  154/83, T-max 99.4. Hemoglobin stable at 10.1,  hematocrit 30.3. It was  felt she was doing well. Foley was DC'd. Continued on therapy, and plans  were made for DC home the next day if she did well. She did do well. No  problems, remained afebrile, and was DC'd home on December 9.   DISCHARGE INSTRUCTIONS:  Diet:  She can resume her regular  prehospitalization diet.   MEDICATIONS:  She may resume her home meds except no Darvocet while on  Vicodin. Her home meds included:   1. Verapamil 240 mg p.o. q.a.m., half tablet p.o. q.p.m..  2. Lisinopril 20 mg p.o. q.a.m.  3. Celebrex 200 mg p.o. q.a.m.  4. Nexium 40 mg p.o. q.a.m.  5. Coumadin dose to be adjusted by the home health pharmacy and fell      back to her baseline which was 3 mg q. Sunday, Tuesday, Thursday,      and Saturday and 1.5 mg q. Monday, Wednesday, Friday.  6. Calcium 600 mg p.o. b.i.d..  7. Darvocet-N 100 one p.o. q.4-6 hours p.r.n. for pain; not to take      while on Vicodin.  8. Crestor 10 mg p.o. q.h.s.  9. Niaspan 500 mg p.o. q.h.s.  10.Fosamax 70 mg p.o. q. Sunday.  11.Multivitamin 1 tablet p.o. q.a.m.  12.Additional meds at this time include Vicodin 5/500 mg 1-2 tablets      p.o. q.4-6 h. p.r.n. for pain.   ACTIVITY:  She can be out of bed partial weightbearing 50% or less on  the left leg with use of the walker.  She is to have home health PT per  [facility].   WOUND CARE:  She is to keep the left hip incision clean and dry and  change dressings daily. She needs to call if any signs of infection. She  may shower after two days with no drainage from the wound. She is to  wear TED hose during the day and knee immobilizer when in bed.   FOLLOWUP:  She needs to follow up with Dr. Cleophas Dunker approximately 14  days after surgery and needs to call [phone] for that appointment.  LABORATORY DATA:  On December 4 left hip film  showed expected appearance  after left hip replacement. On December 7 a portable chest x-ray showed  vascular congestion. On December 8 chest x-ray showed no active  cardiopulmonary disease.   White count  ranged from 8.5 on November 28 to 12.5 on the 6th to 7.8 on  the 8th.  Hemoglobin/hematocrit ranged from 9.8 and 29.6 on November 28  to a low of 9.4 and 28.7 on the 4th to 10.1 and 30.3 on the 8th.  Platelets remained within normal limits.   PT and INR on November 28 were 33 and 3. On December 4 they were 14.4  and 1.1. On December 9 she was 21.4 and 1.8.   Sodium ranged from 137 on November 28 to a low of 130 on the 6th to 137  on December 8.  Glucose ranged from 94 on the 28th to a high of 128 on  December 6. BUN and creatinine were 20 and 1.1 on November 28. Went to  25 and 1.6 on December 6 and then were 15 and 1 on December 8. Calcium  ranged from 9 on the 28th to a low of 7.6 on December 6 and 7th.  December 8 it was 7.9. Total protein on the 8th was 4.8 with an albumin  of 2.1.   On November 28 AST and ALT were 17 and 16. On December 8 they were 68  and 45. Alk phos was elevated at 147.   CK was 185 on December 6. B natriuretic peptide was 394. Iron was 216 on  December 4. Folate was greater than 20.   Urine culture on November 28 showed 40,000 colonies per __________ . All  other laboratory studies were within normal limits.      Legrand Pitts Duffy, P.A.      Claude Manges. Cleophas Dunker, M.D.  Electronically Signed    KED/MEDQ  D:  10/19/2006  T:  10/19/2006  Job:  [id number]

## 2011-02-17 NOTE — Cardiovascular Report (Signed)
[name], [name]                        ACCOUNT NO.:  [id number]   MEDICAL RECORD NO.:  [id number]                   PATIENT TYPE:  INP   LOCATION:  6522                                 FACILITY:  [facility]   PHYSICIAN:  Salvadore Farber, M.D.             DATE OF BIRTH:  15-Aug-1931   DATE OF PROCEDURE:  07/10/2003  DATE OF DISCHARGE:                              CARDIAC CATHETERIZATION   PROCEDURE:  Stent to D-2, balloon angioplasty of D-1.   INDICATIONS FOR PROCEDURE:  [name] is a 77 year old lady who presented  with non-ST segment elevation myocardial infarction. Cardiac catheterization  performed July 10, 2003 by Dr. Gerri Spore demonstrated stenoses in both D-1  and D-2, as well as in the AV groove circumflex. It was unclear which of  these lesions was the culprit for acute coronary syndrome, as all were of  similar severity and none clearly had associated thrombus by angiographic  appearance. The decision was therefore made to treat each of the vessels  percutaneously. Dr. Gerri Spore began with Stenting of the circumflex on  July 10, 2003. She now returns for planned percutaneous intervention of D-  1 and D-2.   PROCEDURAL TECHNIQUE:  Informed consent was obtained. Under 1% lidocaine  local anesthesia, a #6 French sheath was placed in the right femoral artery  using the modified Seldinger technique. Anticoagulation was then initiated  with bivalirudin to achieve an ACT of greater than 225 seconds. A #6 [place]  CLS 3.5 guide was advanced over wire and engaged in the ostium of the left  main coronary. A luge wire was advanced into the distal portion of D-2  without difficulty. The lesion was dilated using a 2.25 x 9 mm Maverick  balloon at 6 atmospheres for 90 seconds. Following this inflation, there was  non-flow limiting dissection. This was fairly hazy, diffusely. Decision was  therefore made to Stent for stabilization. Therefore, a 2.25 x 13 mm pixel  was  deployed at 12 atmospheres. Final angiogram demonstrated no residual  stenosis and TIMI 3 flow to the distal vasculature.   Attention was then turned to the first diagonal. After being unable to  manipulate the luge wire into the vessel, a whisper wire was manipulated  into the distal portion of D-1. The lesion was treated with 2.0 x 15 mm  Maverick at 6 atmospheres for 90 seconds. Angiogram demonstrated non-flow  limiting dissection. Therefore, two further inflations at 4 atmospheres for  2 minutes were employed. The type B dissection persisted. However, there  remained TIMI 3 flow to the distal vasculature and the residual stenosis was  approximately 10%.   The patient tolerated the procedure well and was transferred to the holding  room in stable condition. She should be removed 2 hours after the cessation  of angio-max.   IMPRESSION/RECOMMENDATION:  Successful balloon angioplasty of D-1 and  stenting of D-2 with good angiographic result. Will plan Plavix for  1 year,  given her acute coronary syndrome. Aspirin will be continued indefinitely.                                               Salvadore Farber, M.D.    WED/MEDQ  D:  07/13/2003  T:  07/13/2003  Job:  [id number]   cc:   Rollene Rotunda, M.D.

## 2011-02-17 NOTE — Assessment & Plan Note (Signed)
Her blood pressure appears to be elevated today but I suspect she is for the most part controlled. I've asked that she maintain a low-sodium diet. She will continue her current medications.

## 2011-02-17 NOTE — Assessment & Plan Note (Signed)
She is out of rhythm about 4% of the time. Continue her current medications. I've asked her to call us if she has increasing symptoms of atrial fibrillation.

## 2011-02-17 NOTE — Discharge Summary (Signed)
NAMEADRINNE, SZE NO.:  [id number]   MEDICAL RECORD NO.:  [id number]          PATIENT TYPE:  INP   LOCATION:  3025                         FACILITY:  [facility]   PHYSICIAN:  Payton Doughty, M.D.      DATE OF BIRTH:  19-Aug-1931   DATE OF ADMISSION:  08/17/2005  DATE OF DISCHARGE:  08/20/2005                                 DISCHARGE SUMMARY   ADMITTING DIAGNOSIS:  Spondylosis L2-3.   DISCHARGE DIAGNOSIS:  Spondylosis L2-3.   OPERATIVE PROCEDURE:  L2-3 cliff with posterolateral arthrodesis.   COMPLICATIONS:  None.   DISCHARGE STATUS:  Doing well.   BODY OF TEXT:  This 77 year old right-handed white lady whose history and  physical is recounted on the chart by Dr. Newell Coral.  She has had back pain  for a number of years and spondylosis of L2-3.  She was admitted after  obtaining normal laboratory values and underwent an L2-3 fusion by Dr.  Newell Coral.  Postoperatively she has done well.  The Foley was removed the  first day.  PCA stopped the second day, it is now the third postoperative  day.  She is awake, alert, and oriented.  Her parameters were intact.  Strength is full.  She has a slight fever which is resolved with incentive  spirometer.  She is being discharged home on Percocet for pain and Flexeril  for spasm.  Her followup will be in the [facility]  office with Dr. Newell Coral via a phone call on Monday.           ______________________________  Payton Doughty, M.D.     MWR/MEDQ  D:  08/20/2005  T:  08/21/2005  Job:  [id number]
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
## 2011-02-17 NOTE — Op Note (Signed)
[name], [name]                        ACCOUNT NO.:  [id number]   MEDICAL RECORD NO.:  [id number]                   PATIENT TYPE:  AMB   LOCATION:  ENDO                                 FACILITY:  [facility]   PHYSICIAN:  James L. Malon Kindle., M.D.          DATE OF BIRTH:  1931/02/15   DATE OF PROCEDURE:  08/25/2003  DATE OF DISCHARGE:                                 OPERATIVE REPORT   PROCEDURE:  Esophagogastroduodenoscopy and biopsy.   MEDICATIONS:  Fentanyl 25 micrograms, Versed 3.5 mg IV, Cetacaine spray.   INDICATIONS FOR PROCEDURE:  The patient has a history of GI bleed and  positive stools. She had a non Q-wave MI and was found to have 3-vessel  coronary artery disease. She had an electrophysiology study and some sort of  procedure. She had a Medtronic pacemaker implanted due to  atrial flutter.  Apparently during that process she developed an upper extremity deep venous  thrombosis. She has required  aspirin, Plavix and Coumadin, having acute  admission to the hospital  with a drop in the hemoglobin  to 7.0. Her  stool  was positive. We did an endoscopy showing some erosions and gastritis. This  presumably was the source of her  bleeding and she was treated with Nexium.   She went home and continued to drop her hemoglobin. After a discussion with  Dr. [name], her Coumadin was stopped and it was felt that we should probably  go ahead with the colonoscopy. Her colon had been evaluated 2 to 3 years  prior and was negative by Dr. [name]. At this point her aspirin, Coumadin  and Plavix are on hold for endoscopy and endoscopy today.   DESCRIPTION OF PROCEDURE:  The procedure had been explained to the patient  and consent was obtained. With the patient in the left lateral decubitus  position the Olympus scope was inserted and advanced. The stomach was  entered. The pylorus was identified and passed.   The duodenum including the bulb and 2nd portion were unremarkable.  Several  biopsies were taken of the duodenum. There was no ulceration or  inflammation. Biopsies were sent for evaluation  for celiac disease. The  antrum and body were completely normal. The erosions seen previously were no  longer present. There were no ulcerations or erosions in the forward or  retroflex view of the stomach. The distal and proximal esophagus were  endoscopically normal.   The scope was withdrawn. The patient tolerated the procedure well. She was  maintained on low flow oxygen and pulse oximeter throughout the procedure.   ASSESSMENT:  Heme positive stool, 792.1. No significant findings on  endoscopy to explain this.   PLAN:  Proceed with colonoscopy at this time and will continue the patient  on Nexium.  James L. Malon Kindle., M.D.    Waldron Session  D:  08/25/2003  T:  08/25/2003  Job:  [id number]   cc:   Ernestina Penna, M.D.  [address] [place]  [place] [address]  Fax: [phone]   Rollene Rotunda, M.D.

## 2011-02-17 NOTE — Op Note (Signed)
[name], [name] NO.:  [id number]   MEDICAL RECORD NO.:  [id number]          PATIENT TYPE:  INP   LOCATION:  2899                         FACILITY:  [facility]   PHYSICIAN:  Hewitt Shorts, M.D.DATE OF BIRTH:  [birth year]   DATE OF PROCEDURE:  08/17/2005  DATE OF DISCHARGE:                                 OPERATIVE REPORT   PREOPERATIVE DIAGNOSES:  1.  Lumbar stenosis.  2.  Lumbar spondylosis.  3.  Lumbar degenerative disk disease.   POSTOPERATIVE DIAGNOSES:  1.  Lumbar stenosis.  2.  Lumbar spondylosis.  3.  Lumbar degenerative disk disease.   PROCEDURE:  1.  L2 laminectomy.  2.  L2-3 posterior lumbar interbody fusion with AVS PEEK interbody implants      with VITOSS with bone marrow aspirate and L2-3 posterolateral      arthrodesis with 90D posterior instrumentation and VITOSS with bone      graft with microdissection.   SURGEON:  Hewitt Shorts, M.D.   ASSISTANT:  Clydene Fake, M.D.   ANESTHESIA:  General endotracheal.   INDICATION:  The patient is a 77 year old woman who presented with low back  and lumbar radicular pain.  She had undergone a previous L3-4 PLIF with Ray  cages, but had developed spondylosis and degenerative disk disease at the L2-  3 level with both canal and lateral recess stenosis.  Decision was made to  proceed with decompression and arthrodesis.   DESCRIPTION OF PROCEDURE:  The patient was brought to the operating room and  placed under general endotracheal anesthesia.  The patient was turned to a  prone position and the lumbar region was prepped with Betadine soaping  solution and draped in a sterile fashion.  The midline was infiltrated with  a local anesthetic with epinephrine and a midline incision was made over the  L2-3 level and carried down through the subcutaneous tissue with bipolar  electrocautery and electrocautery was used to maintain hemostasis.  Dissection was carried down to the lumbar fascia, which  was incised  bilaterally, and the paraspinal muscles were dissected over the spinous  processes and lamina in a subperiosteal fashion.  Care was taken around the  L3 level where the previous laminotomy and facetectomy had been performed  for her L3-4 decompression and arthrodesis.  The microscope was then draped  and brought into the field to provide additional magnification, illumination  and visualization, and the remainder of the decompression was performed  using microdissection and microsurgical technique.  Bilateral L2 laminectomy  and facetectomy were performed.  Dissection was carried out laterally over  the transverse processes.  The ligamentum flavum was carefully removed and  we identified the thecal sac and exiting L2 and L3 nerve roots bilaterally.  Thickened ligamentum flavum was carefully removed and we were able to  decompress the thecal sac and nerve roots.  We then identified the L2-3  annulus; there were numerous epidural veins that were coagulated with  bipolar cautery and divided, and we exposed the annulus and incised the  annulus and proceeded with a diskectomy using a variety of pituitary  rongeurs and microcurettes.  There was moderate spondylitic overgrowth in  the posterior aspects of L2 and  L3, and this was carefully removed as well.  We then were able to decompress the thecal sac and nerve roots, and then  proceeded with preparations for the arthrodesis.  The endplate surfaces were  curetted to remove the cartilaginous endplates and expose the bony surface.  We then measured the height of the intervertebral disk space with sizers and  selected a 9 x 20-mm implant.  We then identified the left L3 pedicle entry  site.  The pedicle was probed and bone marrow aspirated and injected over a  10-mL strip of VITOSS.  The implants were then packed with VITOSS and then  carefully retracting the thecal sac and nerve roots, the implants were  positioned and countersunk  within the L2-3 intervertebral disk space  bilaterally.  We then took additional VITOSS with bone marrow aspirate and  packed it in the intervertebral disk space around the implants and used a  tamp to gently position that.  We then proceeded with the posterolateral  arthrodesis.  The transverse processes of L2 and L3 were decorticated  bilaterally.  The C-arm fluoroscopic unit was brought into the field to  provide additional visualization for pedicle screw placement and we  identified the pedicle entry sites bilaterally of L2 and L3.  Each of the  pedicles was probed, examined with a ball probe, no cutouts were found and  good trajectories were noted.  We then tapped each of the pedicles with a  4.25-mm tap.  Again, it was examined with a ball probe; good threading was  noted, no cutouts were seen and then we placed 5.75 x 40-mm screws  bilaterally at each level.  Once all 4 screws were placed, we measured the  space between the screws and selected a 40-mm rod and in fact, cut an 80-mm  rod in half and used half on each side.  Locking caps were placed and the  locking caps were tightened against the counter-torque.  We then placed  InFUSE in the lateral gutters over the transverse process and  intertransverse space at the L2-3 level bilaterally and then packed VITOSS  with bone marrow aspirate over the InFUSE.  We then proceeded with closure.  The wound had been irrigated on a number of occasions throughout the  procedure with Bacitracin solution.  The deep fascia was closed with  interrupted undyed 1 Vicryl sutures, the deep subcutaneous layer was closed  with interrupted undyed 1 Vicryl sutures and the subcutaneous and  subcuticular were closed with interrupted inverted undyed Vicryl sutures and  skin was closed with surgical staples.  Wound was dressed with Adaptic and  sterile gauze.  The procedure was tolerated well.  Estimated blood loss was 300 mL; we did use the Cell Saver during  the procedure, but the technologist  felt that there was insufficient blood loss to warrant spinning down the  collected blood.  Sponge and instrument counts were correct.  Following  surgery, the patient was to be turned back to a supine position, to be  reversed from the anesthetic, extubated and transferred to the recovery room  for further care.      Hewitt Shorts, M.D.  Electronically Signed     RWN/MEDQ  D:  08/17/2005  T:  08/18/2005  Job:  [id number]
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
## 2011-02-17 NOTE — Op Note (Signed)
NAMEGLENNA, BRUNKOW NO.:  [id number]   MEDICAL RECORD NO.:  [id number]          PATIENT TYPE:  INP   LOCATION:  5036                         FACILITY:  [facility]   PHYSICIAN:  Claude Manges. Whitfield, M.D.DATE OF BIRTH:  [birth year]   DATE OF PROCEDURE:  09/04/2006  DATE OF DISCHARGE:                               OPERATIVE REPORT   PREOPERATIVE DIAGNOSIS:  End stage osteoarthritis, left hip.   POSTOPERATIVE DIAGNOSIS:  End stage osteoarthritis, left hip.   PROCEDURE:  Left total hip replacement.   SURGEON:  Claude Manges. Cleophas Dunker, M.D.   ASSISTANT:  Thereasa Distance A. Chaney Malling, M.D.  Oris Drone Petrarca, P.A.-C.   ANESTHESIA:  General orotracheal.   COMPLICATIONS:  None.   COMPONENTS:  DePuy AML 15 mm large stature femoral component (Prodigy),  32 mm hip ball with a +5 neck length, 100 series 52 mm outer diameter  metallic acetabular component with a polyethylene Marathon liner with a  posterior lip.  All were press fit.   DESCRIPTION OF PROCEDURE:  With the patient comfortable on the operating  table and under general orotracheal anesthesia, the nursing staff  inserted a Foley catheter.  The patient was then placed in the lateral  decubitus position with the left side up and secured to the operating  room table with the Innomed Hip System.  The left hip was then prepped  with Betadine scrub and DuraPrep from the iliac crest to about the mid  calf.  Sterile draping was performed.   A routine southern incision was utilized and via sharp dissection,  carried down to the subcutaneous tissue.  Via sharp dissection and using  the Bovie, the adipose tissue was incised to the level of the iliotibial  band.  Self-retaining retractors were inserted. The iliotibial band was  then incised along the length of the skin incision. With the hip  internally rotated, the short external rotators were identified.  Two of  the tendinous structures were tagged with 0 Ethilon suture.  The  remainder of the short external rotators were then released from their  attachment to the posterior aspect of the greater trochanter.  The  capsule was identified and incised along the femoral neck and head.  There was a clear joint effusion.  The head was then dislocated  posteriorly and osteotomized just below the femoral neck using the AML  guide and the oscillating saw. The head was evaluated and was devoid of  articular cartilage and misshapen.   A Mueller retractor was then inserted. A starter hole for reaming of the  canal was made in the piriformis fossa.  The canal finder was inserted.  Reaming was performed sequentially to 14.5 mm to accept a 15 mm  prosthesis.  Rasping was then performed sequentially to a large stature  15 mm component.  The calcar cutter was utilized.  We had a very nice  calcar and nice stable bone.  The retractor was then placed about the  acetabulum.  The labrum was sharply excised.  Reaming was then performed  sequentially to 51 mm to accept a 52 mm outer diameter component.  We  had nice bleeding bone.  We trialed a 50-mm component with a nice rim  fit and it would completely seat.  Accordingly, the 52 mm outer diameter  100 series metallic acetabular component was then impacted into the  acetabulum followed by the screwed trial polyethylene component.   We reinserted the 15 mm large stature rasp followed by several neck  sizes and hip balls.  We thought we had a good stable construct with re-  establishment of leg lengths with a 5 mm neck length and a 32 mm hip  ball.  There was just some suggestion that there may be subluxation with  full internal rotation and flexion. Accordingly, we elected to proceed  with the Prodigy stem.  Accordingly, a trial of the Prodigy had perfect  stability.  The trial components were removed.  The acetabulum was  cleaned with saline.  The apex hole eliminator was inserted followed by  the final Marathon poly with the  posterior lip of 10 degrees  posteriorly.   We then cleaned the femoral canal with saline.  The 15 mm large stature  Prodigy stem was then impacted flush on the calcar.  The Morris taper  neck was cleaned and the 32 mm hip ball with a +5 neck length was then  impacted.  The entire construct was then reduced without difficulty and  through a full range of motion, we had perfect stability and we felt  that the leg lengths were symmetrical.   The capsule was then closed with interrupted #1 Ethibond.  The short  external rotators were reapproximated with a similar material.  The  iliotibial band was closed with a running 0 Vicryl, the subcu in several  layers of 0 and 2-0 Vicryl.  The skin was closed with skin clips.  A  sterile bulky dressing was applied.  The patient was then placed  carefully on the operating stretcher and a knee immobilizer placed to  the left lower extremity.  The patient tolerated the procedure without  complications.  She did receive 2 units of packed cells  interoperatively.  We only lost about 100 mL but her preoperative  hemoglobin and hematocrit were low and these need to be evaluated by her  family physician postoperatively.      Claude Manges. Cleophas Dunker, M.D.  Electronically Signed     PWW/MEDQ  D:  09/04/2006  T:  09/04/2006  Job:  [id number]

## 2011-02-17 NOTE — Cardiovascular Report (Signed)
NAME:  [name], [name]                        ACCOUNT NO.:  [id number]   MEDICAL RECORD NO.:  [id number]                   PATIENT TYPE:  INP   LOCATION:  4708                                 FACILITY:  [facility]   PHYSICIAN:  Carole Binning, M.D. [facility]         DATE OF BIRTH:  1931-02-08   DATE OF PROCEDURE:  07/10/2003  DATE OF DISCHARGE:                              CARDIAC CATHETERIZATION   PROCEDURE PERFORMED:  1. Left heart catheterization with coronary angiography, left     ventriculography, abdominal aortography.  2. Percutaneous transluminal coronary angioplasty with stent placement     utilizing a drug-eluting stent in the second obtuse marginal branch.   INDICATION:  [name] is a 77 year old woman admitted with chest pain.  She ruled in for non-Q-wave myocardial infarction.  EKG showed lateral ST  segment depression and development of T-wave inversion in the anterolateral  leads.  She has mild renal insufficiency.  She was pretreated with sodium  bicarbonate. She was referred for cardiac catheterization.   CATHETERIZATION PROCEDURAL NOTE:  A 6 French sheath was placed in the right  femoral artery.  Coronary angiography was performed using a 6 [place] JL-4  and JR-4 catheters.  Left ventriculography and abdominal aortography were  performed with a 6 French angled pigtail catheter.  Contrast was Omnipaque.  There were no complications.   CATHETERIZATION RESULTS:   HEMODYNAMICS:  1. Left ventricular pressure 134/22.  2. Aortic pressure 150/96.  3. There is no aortic valve gradient.   LEFT VENTRICULOGRAM:  Wall motion is normal.  Ejection fraction is estimated  at greater than or equal to 65%.  There is no mitral regurgitation.   ABDOMINAL AORTOGRAM:  Reveals probable fibromuscular dysplasia of  the  distal right renal artery.  The left renal artery appears normal.  There is  mild atherosclerotic disease of the abdominal aorta and iliac arteries.   CORONARY  ARTERIOGRAPHY (LEFT DOMINANT):  Left main has an ostial 30%  stenosis and a distal 20% stenosis.   Left anterior descending artery has a 50% stenosis in the mid vessel.  The  very distal LAD appears to have a subtotal occlusion with TIMI-2 flow.  There is no discrete lesion there, but clearly this is a delayed filling of  the distal vessel.  It is a relatively small caliber vessel and very  tortuous at that point.  The LAD gives rise to a normal size first diagonal  and a large second diagonal.  The first diagonal has a diffuse 90% stenosis.  The second diagonal has a discrete 80% stenosis.   The left circumflex is a dominant vessel.  It gives rise to a small first  marginal branch, a very large branching second obtuse marginal.  The distal  circumflex gives rise to a normal size first posterior lateral branch and a  normal size posterior descending artery.  The distal circumflex has a 60%  stenosis prior to the posterior  lateral branch.  The second marginal which  is a large vessel has a 40% stenosis at its ostium followed by an 80%  stenosis with haziness.  Just beyond this lesion the vessel bifurcates in  the larger of the two branches which is more lateral.  There is a 70%  stenosis.   Right coronary artery is a small nondominant vessel.  There is a 99%  stenosis in the proximal vessel with filling defect consistent with probable  thrombus.  Following this, there is a 95% stenosis.  There is TIMI-2 flow  into the distal vessel.   IMPRESSION:  1. Normal left ventricular systolic function.  2. Probable right renal artery fibromuscular dysplasia.  3. Three-vessel coronary artery disease.  It is difficult to determine the     culprit for the patient's recent non-Q-wave myocardial infarction.  It     could be the nondominant right coronary, but this is a very small     nondominant vessel.  The LAD also has delayed flow in the very apical     portion without a discrete stenosis.  In  addition, the 80% stenosis in     the obtuse marginal branch is somewhat hazy.   PLAN:  These findings were reviewed with both Dr. Juanda Chance and Dr. [name].  Because the LAD is a poor target for bypass and the right coronary artery is  a nondominant vessel, it is not clear that bypass surgery would provide [place] a  survival benefit.  After lengthy discussion, we opted to proceed with  percutaneous coronary intervention of the second obtuse marginal branch  today followed by probable staged percutaneous coronary intervention of the  two diagonal branches.   PERCUTANEOUS TRANSLUMINAL CORONARY ANGIOPLASTY PROCEDURAL NOTE:  Following  completion of the diagnostic catheterization, we proceeded with percutaneous  coronary intervention.  We utilized pre-existing 6 French sheath in the  right femoral artery.  The patient had been on Integrelin and heparin prior  to coming to the catheterization laboratory.  Additional heparin was  administered to maintain an ACT greater than 200 seconds.  We used a 6  [place] CLS 3.5 guiding catheter.  A BMW wire was advanced under fluoroscopic  guidance into the distal aspect of the second obtuse marginal branch.  We  then deployed a 3.0 x 12-mm Taxus drug-eluting stent across the 80% stenosis  in the obtuse marginal branch.  This stent was deployed at 10 atmospheres.  We then went back with a 3.0 x 12.mm Quantum balloon and post dilated the  stent to a pressure of 16 atmospheres.  Nitroglycerin was administered to  relieve coronary vasospasm.  Final angiographic images were obtained  revealing patency of the obtuse marginal with 0% residual stenosis at the  stent site and TIMI-3 flow. We opted not to treat the more distal 70%  stenosis due to the small caliber of the vessel and the diffuse nature of  the disease.   COMPLICATIONS:  None.   RESULTS:  Successful placement of a drug-eluting stent in the second obtuse marginal branch. An 80% stenosis was reduced to 0%  residual TIMI-3 flow.    PLAN:  Integrelin will be continued for 18 hours.  Plavix will be  administered for recommended nine to 12 months.  We anticipate proceeding  with staged intervention of the diagonal branches in the next few days  depending on the patient's serum creatinine.  Carole Binning, M.D. [facility]    MWP/MEDQ  D:  07/10/2003  T:  07/10/2003  Job:  [id number]   cc:   [facility]   Rollene Rotunda, M.D.

## 2011-02-17 NOTE — Assessment & Plan Note (Signed)
Her device is working normally. She is approaching elective replacement as she is probably about a year away from this. We'll see her back in a year.

## 2011-02-17 NOTE — Patient Instructions (Signed)
Your physician wants you to follow-up in: 12 months with Dr Taylor You will receive a reminder letter in the mail two months in advance. If you don't receive a letter, please call our office to schedule the follow-up appointment.   Remote monitoring is used to monitor your Pacemaker of ICD from home. This monitoring reduces the number of office visits required to check your device to one time per year. It allows us to keep an eye on the functioning of your device to ensure it is working properly. You are scheduled for a device check from home on 05/18/2011 . You may send your transmission at any time that day. If you have a wireless device, the transmission will be sent automatically. After your physician reviews your transmission, you will receive a postcard with your next transmission date.   

## 2011-02-17 NOTE — Assessment & Plan Note (Signed)
[facility]                           ELECTROPHYSIOLOGY OFFICE NOTE   [name], [name]                     MRN:          [id number]  DATE:06/11/2006                            DOB:          12-25-1930    Ms. Tech seen in Device Clinic today June 11, 2006, for routine  follow up of her Medtronic Kappa 901 dual chamber pacemaker implanted  October of 2004.  Upon interrogation, battery voltage was 2.77 v.  In the  atrium, intrinsic amplitude is 2 mV with an impedance of 492 ohms and a  threshold of 0.5 v at 0.4 msec.  In the right ventricle intrinsic amplitude  is 8 mV with an impedance of 545 ohms and threshold of 0.75 v at 0.4 msec.  Her paced AV was increased from 220 msec to 250 msec.  She did have minimal  ventricular pacing on histograms, but she did have considerable pacing while  she was sitting in the chair in clinic, so that was increased to accommodate  intrinsic conduction.  No other changes were made.  She will begin [facility]  transmissions to be completed at 3, 6 and 9 months and be seen in clinic in  followup in 1 year.                                   Cleatrice Burke, RN                                [name]. [name], MD   CF/MedQ  DD:  06/11/2006  DT:  06/11/2006  Job #:  [id number]

## 2011-02-17 NOTE — Discharge Summary (Signed)
[name], [name]                        ACCOUNT NO.:  [id number]   MEDICAL RECORD NO.:  [id number]                   PATIENT TYPE:  INP   LOCATION:  5529                                 FACILITY:  [facility]   PHYSICIAN:  Santiago Bumpers. Hensel, M.D.             DATE OF BIRTH:  19-Aug-1931   DATE OF ADMISSION:  07/31/2003  DATE OF DISCHARGE:  08/05/2003                                 DISCHARGE SUMMARY   DISCHARGE DIAGNOSES:  1. Gastrointestinal bleeding secondary to gastritis with erosion.  2. Deep venous thrombosis of the left distal subclavian vein and proximal to     mid axillary vein.  3. Hypertension.  4. Status post percutaneous transluminal coronary angioplasty, status post     pacemaker insertion for tachy/brady syndrome.   DISCHARGE MEDICATIONS:  1. Aspirin enteric-coated 81 mg once a day p.o.  2. Plavix 75 mg once a day p.o.  3. Coumadin 5 mg per tablet, 1/2 tablet daily.  4. Ferrous sulfate 325 mg three times a day p.o.  5. Nexium 40 mg once a day p.o.   FOLLOW UP:  1. With [facility] within the next few days.     The patient was instructed to call for appointment.  2. Follow up with Dr. Antoine Poche on August 26, 2003.  3. Follow up with Dr. [name] on January 11, at 11:15 a.m.   HISTORY OF PRESENT ILLNESS:  This is a 77 year old white female with history  of coronary artery disease, status post PCI and two admissions to [facility] for stent placement.  The patient was placed on aspirin and Plavix.  The patient was readmitted one week later for left arm DVT and started on  Coumadin.  The patient presented with black tarry stools since then.  Consulted [facility] and found to have a  hemoglobin in the 7's.  Sent to [facility] for admission.   PAST MEDICAL HISTORY:  1. CAD, severe three vessel disease, status post NS DEM1 in October of 2004,     status post PCI x2.  2. History of atrial  flutter, status post ablation in October of 2004 and     subsequent dual chamber pacemaker placement secondary to sinus node     dysfunction and tachy/brady syndrome.  3. Left arm DVT.  4. Dyslipidemia.  5. Hypertension.   ALLERGIES:  CODEINE, VIOXX, LOPID.   SOCIAL HISTORY:  Married.  Quit smoking in 1999.   PHYSICAL EXAMINATION:  VITAL SIGNS:  Blood pressure 131/72, heart rate 74,  respiratory rate 16, temperature 97.1.  GENERAL:  Pleasant lady in no acute distress.  HEENT:  PERRLA.  Extraocular muscles intact.  NCAT. Oropharynx without  erythema or exudate.  NECK:  Supple, no lymphadenopathy.  HEART:  Regular rate and rhythm with no murmurs.  LUNGS:  Clear to auscultation bilaterally.  ABDOMEN:  Positive bowel sounds, soft, nontender,  and no palpable masses.  NEUROLOGY:  Alert and oriented x3.  Cranial nerves II-XII grossly intact.  Strength 5/5 in both lower and upper extremities.  RECTAL:  Minimal __________ involved. Normal sphincter tone with external  hemorrhoids.   LABORATORY DATA:  Hemoglobin 7.9, hematocrit 24, WBC 11.4, platelets 318,  MCV 91.7, PTT 73, PT 28.6, INR 4.2.  Sodium 139, potassium 4.4, chloride  107, CO2 27, BUN 33, creatinine 1.5, glucose 101.   HOSPITAL COURSE:  Problem 1.  Gastrointestinal bleeding.  The patient was  transfused two units of packed red blood cells.  The patient was referred to  GI who did EGD which showed gastritis with erosion.  The patient was started  on oral PPI's.  Coumadin and aspirin were discontinued.  The patient's  hemoglobin was monitored during her hospital stay and her hemoglobin  remained stable throughout.   Problem 2.  Coronary artery disease, status post stent.  The patient  remained asymptomatic throughout the hospital stay.  Did not complain of any  chest pain.   Problem 3.  Left arm DVT.  The patient underwent venous Dopplers of the left  arm which showed nonocclusion DVT of the distal subclavian vein and   occlusive DVT of proximal to mid axillary vein with collateral flow noted.  Prior to discharge, the patient was restarted on Coumadin.   Problem 4.  Hypertension.  The patient had stable blood pressure all  throughout her hospital stay.  Cardiology consult was done and they  suggested continue Plavix and maintain the patient on Coumadin for four to  six weeks and restarting aspirin which she will take indefinitely.   CONDITION ON DISCHARGE:  The patient was discharged with stable vital signs  with hemoglobin of 9.3 and hematocrit of 27.3.  The patient was instructed  to follow up with cardiology and with the Coumadin Clinic at [facility] so that her INR can be monitored.  She was  instructed to consult if she continues to have __________.      Valerie Sabal, MD                         Santiago Bumpers. [name], M.D.    MC/MEDQ  D:  08/08/2003  T:  08/09/2003  Job:  [id number]   cc:   [facility]   Rollene Rotunda, M.D.   [name]. [name], M.D.
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
## 2011-02-17 NOTE — Op Note (Signed)
NAMEAMMANDA, DOBBINS                        ACCOUNT NO.:  [id number]   MEDICAL RECORD NO.:  [id number]                   PATIENT TYPE:  INP   LOCATION:  2308                                 FACILITY:  [facility]   PHYSICIAN:  [name]. [name], M.D.               DATE OF BIRTH:  18-Jun-1931   DATE OF PROCEDURE:  07/10/2003  DATE OF DISCHARGE:                                 OPERATIVE REPORT   INDICATIONS:  Ms. Simeon Craft is a very pleasant 77 year old woman who was  admitted to the hospital sustaining a non-Q-wave myocardial infarction.  Prior to that, she had periods of near syncope and dizziness.  She underwent  angioplasty demonstrating significant coronary disease and underwent initial  percutaneous intervention of the left circumflex coronary artery.  In the  post-catheterization period, the patient had a recurrence of her 1:1 atrial  flutter as manifested by a narrow Q-R-S tachycardia at rates of over 200  beats per minute.  Adenosine 6 mg was given, and this resulted in a  prolongation of the A-V conduction and clear documentation of atrial flutter  which was typical in appearance.  The patient was treated with IV beta  blockers because of continued episodes of 1:1 atrial flutter at rates of  over 200 beats per minute.  She then developed periods where she would have  post-termination pauses of her atrial flutter which would last for as long  and 10 seconds resulting in unresponsiveness and brief seizure activity.  Because her symptoms were uncontrollable with medications, she is now  referred for atrial flutter ablation and possible pacemaker insertion.   PROCEDURE:  After informed consent was obtained, the patient was taken to  the diagnostic EP lab in the fasting state.  After usual preparation and  draping, intravenous fentanyl and midazolam was given for sedation. A 6-  [place] Hexapolar catheter was inserted percutaneously into the right jugular  vein and advanced to the  coronary sinus.  A 7-French 20 pole Halo catheter  was inserted percutaneously into the right femoral vein and advanced into  the right atrium.  A 5-French quadripolar catheter was inserted  percutaneously into the right femoral vein and advanced to the His bundle  region.  After measurement of the basic intervals, rapid atrial pacing was  carried out from the coronary sinus and stepwise decreased down to 300 msec  where A-V Wenckebach was observed.  Additional decrements in the patient's  cycle length resulted in the initiation of atrial flutter.  As before, the  atrial flutter demonstrated 1:1 A-V conduction.  It was counterclockwise,  tricuspid annular re-entry.  The ablation catheter was then maneuvered into  the usual region of the atrial flutter isthmus, and two RF energy  applications were delivered and resulted in termination of atrial flutter  and restoration of junctional rhythm.  It should be noted that with  termination of atrial flutter, the patient had a 5-second  pause.  Following  this, she had junctional rhythm.  Pacing was then carried out from the  coronary sinus demonstrating residual isthmus conduction.  A total of nine  additional RF energy applications were then delivered.  During the overall  tenth RF energy application, atrial flutter isthmus conduction was  terminated permanently.  A single bonus RF energy application was delivered,  and the patient was observed for approximately 30 minutes with no residual  isthmus conduction.  At this point, the patient's underlying rhythm was  junctional at a rate of 50.  Because of her need with underlying coronary  disease for additional beta blocker therapy and because of her symptomatic  junctional rhythm with long pauses prior to ablation, she is now referred  for permanent pacemaker insertion.   After the usual preparation and draping, a total of 30 cc of lidocaine was  infiltrated in the left infraclavicular region.  A  6-cm incision was carried  out over this region and electrocautery utilized to dissect down to the  fascial plane.  The left subclavian vein was subsequently punctured x2, and  the Medtronic Model 5076, 52-cm active fixation pacing lead, Serial  #[id number] was advanced into the right ventricle, and the Medtronic Model  #5076, 45-cm active fixation pacing lead, Serial #[id number] was advanced  into the right atrium.  Mapping was carried out in the right ventricle where  R waves measured 8 mV and the lead, once actively fixed, had a pacing  threshold of 0.6 volts at 0.5 msec and a pacing impedance of 854 ohms.  The  10-volt pacing did not stimulate the diaphragm.  With the ventricular lead  in satisfactory position, attention was then turned to the atrial lead where  mapping was carried in the anterolateral wall of the right atrium.  Retrograde P waves measured 1.5 mV.  The atrial threshold was 0.5 volts at  0.5 msec once the lead was actively fixed with pacing impedance of 513 ohms.  Again, 10-volt pacing did not stimulate the diaphragm.  With the atrial lead  in satisfactory position, both atrial and ventricular leads were secured to  the subpectoralis fascia with a figure-of-eight silk suture.  In addition,  the sew-in sleeve was secured with silk suture.  At this point,  electrocautery was utilized to assure hemostasis and create a subcutaneous  pocket.  Kanamycin irrigation was utilized to irrigate the pocket.  The  Medtronic Kappa 900, Serial [id number] was connected to the atrial and  ventricular pacing leads and placed in the subcutaneous pocket.  The  generator was secured with a silk suture.  Additional Kanamycin was utilized  to irrigate the pocket, and the pacemaker was inserted into the pocket and  secured with a silk suture.  Additional Kanamycin was utilized to irrigate  the pocket, and the incision was closed with a layer of 2-0 Vicryl, followed by layer of 3-0 Vicryl,  followed by a layer of 4-0 Vicryl.  Benzoin was  painted on the skin.  Steri-Strips were applied, and a pressure dressing  placed, and the patient returned to her room in satisfactory condition.   COMPLICATIONS:  There were no immediate procedural complications.   RESULTS:  A.  Baseline ECG:  The baseline ECG at the time of procedure  demonstrated a junctional rhythm at 50 beats per minute.  B.  Baseline intervals:  The H-V interval was 47 msec.  The junctional cycle  length was 1200 msec.  C.  Rapid atrial pacing:  Rapid atrial  pacing demonstrated an A-V Wenckebach  cycle length of 300 msec.  D.  Programmed atrial stimulation:  Programmed atrial stimulation was  carried out from the coronary sinus at a base-adjusted cycle length of 600  msec.  The S1 and S2 interval was stepwise decreased down to 300 msec where  atrial refractorance was observed.  During programmed atrial stimulation,  there was no A-H jumps, no echo beats.  E.  Arrhythmias observed:  Atrial flutter initiation, rapid atrial pacing,  duration sustained, cycle length 310 msec with 1:1 A-V conduction.  Method  of termination was spontaneous and with catheter ablation.  F.  Mapping:  Mapping of the patient's atrial flutter demonstrated the usual  atrial flutter isthmus size and orientation.  G.  RF energy application:  A total of 11 RF energy applications were  delivered to the usual atrial flutter isthmus resulting in termination of  atrial flutter, restoration of junctional rhythm, and creation of  bidirectional block in the atrial flutter isthmus.   CONCLUSIONS:  This study demonstrates successful electrophysiologic study  and RF catheter ablation of typical atrial flutter followed by successful  insertion of a Medtronic dual-chamber pacemaker in a patient with rapid  atrial flutter and 1:1 A-V conduction, prolonged asystole of up to 10  seconds following post-termination of atrial flutter, and underlying sinus  node  dysfunction as manifest by junction rhythm at 50 beats per minute.                                               [name]. [name], M.D.    GWT/MEDQ  D:  07/10/2003  T:  07/11/2003  Job:  [id number]   cc:   Bertis Ruddy, M.D.   [facility]   Rollene Rotunda, M.D.

## 2011-02-17 NOTE — Progress Notes (Signed)
HPI   [name] returns today for followup. She is a 77 year old woman, with a history of paroxysmal atrial fibrillation. She has symptomatic bradycardia and is status post permanent pacemaker insertion. She has a history of coronary disease and is status post MI. Her atrial arrhythmias have been controlled with beta blockers and calcium channel blockers. She is on warfarin. She does experience palpitations. These are typically short-lived. Allergies  Allergen Reactions  . Codeine   . Lopid (Gemfibrozil)   . Naprosyn (Naproxen)     Upset stomach    . Percocet (Oxycodone-Acetaminophen)     Nausea       Current Outpatient Prescriptions  Medication Sig Dispense Refill  . Calcium Carbonate-Vitamin D (CALCIUM 600+D) 600-400 MG-UNIT per tablet Take 2 tablets by mouth daily.        . Cholecalciferol (VITAMIN D) 1000 UNITS capsule Take 1,000 Units by mouth daily.        Marland Kitchen esomeprazole (NEXIUM) 40 MG capsule 1 tab po every  other day       . ezetimibe (ZETIA) 10 MG tablet Take 10 mg by mouth daily.        . furosemide (LASIX) 20 MG tablet Take 20 mg by mouth as needed.        . IRON PO Take by mouth daily.        Marland Kitchen lisinopril (PRINIVIL,ZESTRIL) 20 MG tablet Take 20 mg by mouth daily.        . Multiple Vitamin (MULTIVITAMIN) capsule Take 1 capsule by mouth daily.        . nebivolol (BYSTOLIC) 5 MG tablet Take 5 mg by mouth daily.        . Omega-3 Fatty Acids (FISH OIL) 1000 MG CAPS 2 tabs po qd       . rosuvastatin (CRESTOR) 10 MG tablet Take 10 mg by mouth daily.        . traMADol (ULTRAM) 50 MG tablet Take 50 mg by mouth every 6 (six) hours as needed.        . verapamil (CALAN-SR) 240 MG CR tablet 1 tab 1/2 tab po qhs       . vitamin B-12 (CYANOCOBALAMIN) 250 MCG tablet Take 250 mcg by mouth daily.        . vitamin C (ASCORBIC ACID) 500 MG tablet Take 500 mg by mouth daily.        Marland Kitchen warfarin (COUMADIN) 3 MG tablet Take 3 mg by mouth as directed.        Marland Kitchen DISCONTD: aspirin 81 MG tablet  Take 81 mg by mouth daily.           Past Medical History  Diagnosis Date  . Atrial flutter   . Tachycardia-bradycardia syndrome   . HTN (hypertension)   . Chronic anticoagulation   . Small bowel problem 12/2007  . AMI (acute myocardial infarction)   . Pacemaker   . DVT (deep venous thrombosis)   . GI bleeding   . Hyperlipidemia   . DDD (degenerative disc disease), lumbar   . DJD (degenerative joint disease) of knee     bilateral    . DJD (degenerative joint disease)     hips   . Chronic pain   . Back pain   . Hypertension     ROS:   All systems reviewed and negative except as noted in the HPI.   Past Surgical History  Procedure Date  . Small bowel capsule endoscopy.  12/10/2007   . Esophagogastroduodenoscopy.  11/06/2007   . Hip arthroplasty-total.. 09/04/2006      No family history on file.   History   Social History  . Marital Status: Married    Spouse Name: N/A    Number of Children: N/A  . Years of Education: N/A   Occupational History  . Not on file.   Social History Main Topics  . Smoking status: Former Smoker    Quit date: 02/17/1996  . Smokeless tobacco: Never Used  . Alcohol Use: No  . Drug Use: No  . Sexually Active: Not on file   Other Topics Concern  . Not on file   Social History Narrative  . No narrative on file     BP 152/86  Ht 5\' 6"  (1.676 m)  Wt 165 lb (74.844 kg)  BMI 26.63 kg/m2  Physical Exam:  Well appearing NAD HEENT: Unremarkable Neck:  No JVD, no thyromegally Lymphatics:  No adenopathy Back:  No CVA tenderness Lungs:  Clear. Well-healed pacemaker incision. HEART:  Regular rate rhythm, no murmurs, no rubs, no clicks Abd:  Flat, positive bowel sounds, no organomegally, no rebound, no guarding Ext:  2 plus pulses, no edema, no cyanosis, no clubbing Skin:  No rashes no nodules Neuro:  CN II through XII intact, motor grossly intact  DEVICE  Normal device function.  See PaceArt for details.   Assess/Plan:

## 2011-02-17 NOTE — Discharge Summary (Signed)
   [name], [name]                        ACCOUNT NO.:  [id number]   MEDICAL RECORD NO.:  [id number]                   PATIENT TYPE:  INP   LOCATION:  A301                                 FACILITY:  [facility]   PHYSICIAN:  Valerie Hays. Dechurch, M.D.           DATE OF BIRTH:  [birth year]   DATE OF ADMISSION:  07/08/2003  DATE OF DISCHARGE:  07/08/2003                                 DISCHARGE SUMMARY   TRANSFER SUMMARY:  The patient is a 77 year old Caucasian female who presented through the  emergency room this morning complaining of abdominal pain for two days'  duration.  This was preceded by some right neck pain that came on Saturday  but dissipated after a few minutes.  She had a second episode on Monday  afternoon at work associated with some orthostasis.  She presented to her  primary M.D., where she was noted to have some rales and rhonchi and was  treated as bronchitis.  An EKG at that time revealed no acute ischemic  changes.  The patient noted progressive abdominal pain.  In the emergency  room abdominal films revealed some thickened small-bowel loops.  She,  indeed, had a distended abdominal which was quite tender.  Leukocytosis and  mild elevation of her OT and SGPT.  An EKG was done to complete her data set  and revealed a junctional rhythm.  This was prior to knowing that her EKG  from July 06, 2003, was normal.  In any event, enzymes were obtained which  revealed a CK of 389, MB 41, relative index 10.6, and a troponin of 13.8.  The patient has no pain at this time, abdominal or chest wall.  She does  have some fine rales on exam.  She has a regular cardiac rhythm with a soft  systolic murmur at the left sternal border, but no gallop is present.  Her  abdomen is slightly protuberant, soft, slightly tender, but much improved  with normal bowel function.  She has no edema.  She has good distal pulses.  No bruits are noted.   ASSESSMENT AND PLAN:  Elevated cardiac  enzymes with EKG changes.  The  patient was discussed with Dr. Lewayne Bunting, who has agreed to accept her in  transfer.  She is being transferred via [facility] once a bed is available.  The plan was discussed with the patient and her niece, who will discuss with  her spouse.  The patient is stable at the time of transfer.     ___________________________________________                                         [name], M.D.   FED/MEDQ  D:  07/08/2003  T:  07/08/2003  Job:  [id number]

## 2011-02-17 NOTE — Assessment & Plan Note (Signed)
[facility]                           ELECTROPHYSIOLOGY OFFICE NOTE   [name], [name]                     MRN:          [id number]  DATE:07/24/2006                            DOB:          08-19-1931    REFERRING PHYSICIAN:  Claude Manges. Cleophas Dunker, M.D.   [name] is referred today by Dr. Norlene Campbell for preoperative  evaluation for pending hip surgery. The patient is a very pleasant 77-year-  old woman with a history of atrial flutter and 1:1 AV conduction with long  post termination pauses who underwent permanent pacemaker insertion and  flutter ablation back 3 years ago. At that time, she also had right  shoulder, neck and jaw pain and was found to have coronary disease and  underwent successful two-vessel stenting. She has done well since then from  a cardiovascular perspective. She has no heart failure, she has no anginal  symptoms and has very little if any palpitations. While she has been active  in the past, she has recently developed increasing problems with hip pain  and has been seen by Dr. Cleophas Dunker and is now considering left hip surgery  and is referred here for preoperative evaluation. The patient remains active  and has had no symptoms of heart failure as noted and not symptoms of  angina. She denies peripheral edema.   PHYSICAL EXAMINATION:  GENERAL:  She is a pleasant, well-appearing, 77-year-  old woman in no distress.  VITAL SIGNS:  Blood pressure 142/76, the pulse 76 and regular, the  respirations were 18.  NECK:  Reveals no jugular venous distention.  LUNGS:  Clear bilaterally to auscultation. There are no wheezes, rales or  rhonchi.  CARDIOVASCULAR:  Revealed a regular rate and rhythm, normal S1 and S2.  EXTREMITIES:  Demonstrated no cyanosis, clubbing or edema.   EKG demonstrated sinus rhythm with atrial pacing. Review of her cholesterol  demonstrates LDL of 93.   IMPRESSION:  1. Coronary artery disease.  2.  Symptomatic bradycardia.  3. Paroxysmal atrial flutter.  4. Hip problems for pinning and hip surgery.   DISCUSSION:  The patient is low risk for major cardiovascular complications  for pinning and hip surgery. I recommend that she be allowed to proceed with  this. Should she have any additional problems will be available to see her  on an as needed basis.            ______________________________  [name]. [name], MD     GWT/MedQ  DD:  07/24/2006  DT:  07/25/2006  Job #:  [id number]   cc:   Claude Manges. Cleophas Dunker, M.D.  Ernestina Penna, M.D.

## 2011-02-17 NOTE — Op Note (Signed)
   NAMERHIANNAN, KIEVIT                        ACCOUNT NO.:  [id number]   MEDICAL RECORD NO.:  [id number]                   PATIENT TYPE:  INP   LOCATION:  5529                                 FACILITY:  [facility]   PHYSICIAN:  James L. Malon Kindle., M.D.          DATE OF BIRTH:  1931/03/31   DATE OF PROCEDURE:  08/02/2003  DATE OF DISCHARGE:                                 OPERATIVE REPORT   PROCEDURE:  Esophagogastroduodenoscopy.   MEDICATIONS:  Cetacaine spray, fentanyl 60 mg, Versed 6 mg IV.   INDICATIONS FOR PROCEDURE:  Low grade GI bleed secondary to over-  anticoagulation.   DESCRIPTION OF PROCEDURE:  The procedure had been explained to the patient  and consent obtained.  With the patient in the left lateral decubitus  position, the Olympus endoscope was inserted and advanced under direct  visualization.  The stomach was entered, the pyloris identified and passed.  The duodenum including the bulb and second portion was seen well.  The scope  was withdrawn back into the stomach.  There were gastric erosions in the  stomach with no active ulceration or bleeding.  Slight inflammation  consistent with gastritis.  The proximal stomach was normal.  The scope was  withdrawn and the distal and proximal esophagus were normal.   ASSESSMENT:  1. Gastritis with erosions, possibly the source of bleed and we will go     ahead and keep her on PPI's.  Will follow clinically.  Questionable BE in     the future if stools remain positive, since she has had previous     colonoscopy within a year or so.                                               James L. Malon Kindle., M.D.    Waldron Session  D:  08/02/2003  T:  08/02/2003  Job:  [id number]   cc:   [name], M.D.  [address] [place]  [place] [address]  Fax: [phone]   Ernestina Penna, M.D.  [address] [place]  [place] [address]  Fax: [phone]   Medora Bing, M.D.
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
## 2011-02-17 NOTE — Op Note (Signed)
[name], [name]                        ACCOUNT NO.:  [id number]   MEDICAL RECORD NO.:  [id number]                   PATIENT TYPE:  AMB   LOCATION:  ENDO                                 FACILITY:  [facility]   PHYSICIAN:  [name]., M.D.          DATE OF BIRTH:  1931/03/25   DATE OF PROCEDURE:  08/25/2003  DATE OF DISCHARGE:                                 OPERATIVE REPORT   PROCEDURE:  Colonoscopy.   MEDICATIONS:  The patient received a total of Fentanyl 45  micrograms and  Versed 4 mg for both procedures.   INDICATIONS FOR PROCEDURE:  Please see dictated endoscopy note.   ENDOSCOPE:  Olympus pediatric scope.   DESCRIPTION OF PROCEDURE:  Following the upper endoscopy the patient was  turned around and a digital examination  was performed and the scope was  inserted and advanced. The prep was excellent. She had marked diverticular  disease of the sigmoid colon.   After we were eventually able to pass this we were able to advance to the  cecum using abdominal  pressure and position  changes. The ileocecal valve  was seen and documented photographically to be in the cecum. I carefully  examined for AVMs and did not see any.   The scope was withdrawn.  The cecum, ascending colon, transverse colon,  descending and sigmoid colon were seen well and were normal other than  diverticular disease. No AVMs or polyps were seen.   The rectum was free of polyps. There were internal hemorrhoids seen in the  rectum and anal canal upon removal. The scope was withdrawn. The patient  tolerated the procedure well.   ASSESSMENT:  1. Heme positive stools, 792.1.  2. Diverticulosis, 562.10.  3. Internal hemorrhoids 455.0.   FOLLOW UP:  She will follow up with Dr. Antoine [name] and will resume aspirin  and Plavix today. Dr. Antoine [name] will see her in the office as already planned  and make a decision regarding her Coumadin.                                               [name]., M.D.    Waldron Session  D:  08/25/2003  T:  08/25/2003  Job:  [id number]   cc:   [name], M.D.  [address] [place]  [place] [address]  Fax: [phone]   [name], M.D.
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
## 2011-02-28 ENCOUNTER — Encounter: Payer: Self-pay | Admitting: Nurse Practitioner

## 2011-03-15 ENCOUNTER — Other Ambulatory Visit: Payer: Self-pay | Admitting: Neurosurgery

## 2011-03-15 DIAGNOSIS — M545 Low back pain: Secondary | ICD-10-CM

## 2011-03-22 ENCOUNTER — Ambulatory Visit
Admission: RE | Admit: 2011-03-22 | Discharge: 2011-03-22 | Disposition: A | Discharge status: Home / Self Care | Payer: Medicare Other | Source: Ambulatory Visit | Attending: Neurosurgery | Admitting: Neurosurgery

## 2011-03-22 ENCOUNTER — Other Ambulatory Visit: Payer: Medicare Other

## 2011-03-22 DIAGNOSIS — M545 Low back pain: Secondary | ICD-10-CM

## 2011-05-18 ENCOUNTER — Other Ambulatory Visit: Payer: Self-pay | Admitting: Internal Medicine

## 2011-05-18 ENCOUNTER — Ambulatory Visit (INDEPENDENT_AMBULATORY_CARE_PROVIDER_SITE_OTHER): Payer: Medicare Other | Admitting: *Deleted

## 2011-05-18 ENCOUNTER — Encounter: Payer: Self-pay | Admitting: Internal Medicine

## 2011-05-18 DIAGNOSIS — I4892 Unspecified atrial flutter: Secondary | ICD-10-CM

## 2011-05-18 DIAGNOSIS — I4891 Unspecified atrial fibrillation: Secondary | ICD-10-CM

## 2011-05-18 DIAGNOSIS — Z950 Presence of cardiac pacemaker: Secondary | ICD-10-CM

## 2011-05-19 LAB — REMOTE PACEMAKER DEVICE
AL IMPEDENCE PM: 527 Ohm
BAMS-0001: 160 {beats}/min
RV LEAD IMPEDENCE PM: 564 Ohm
RV LEAD THRESHOLD: 0.75 V

## 2011-05-31 NOTE — Progress Notes (Signed)
Pacer remote check  

## 2011-06-14 ENCOUNTER — Encounter: Payer: Self-pay | Admitting: *Deleted

## 2011-06-23 LAB — HEMOGLOBIN AND HEMATOCRIT, BLOOD
HCT: 24 — ABNORMAL LOW
HCT: 27.8 — ABNORMAL LOW
HCT: 32.6 — ABNORMAL LOW
Hemoglobin: 7.4 — CL
Hemoglobin: 8.7 — ABNORMAL LOW
Hemoglobin: 9.9 — ABNORMAL LOW

## 2011-06-23 LAB — BASIC METABOLIC PANEL
BUN: 9
CO2: 25
Calcium: 9
Calcium: 9.5
GFR calc Af Amer: >60
GFR calc non Af Amer: 50 — ABNORMAL LOW
GFR calc non Af Amer: 55 — ABNORMAL LOW
Glucose, Bld: 86
Potassium: 4.2
Potassium: 4.2
Sodium: 138
Sodium: 140

## 2011-06-23 LAB — DIFFERENTIAL
Basophils Absolute: 0.1
Basophils Relative: 1
Eosinophils Relative: 2
Lymphocytes Relative: 45
Monocytes Absolute: 0.8
Monocytes Relative: 9

## 2011-06-23 LAB — URINALYSIS, ROUTINE W REFLEX MICROSCOPIC
Glucose, UA: NEGATIVE
Hgb urine dipstick: NEGATIVE
Protein, ur: NEGATIVE
Specific Gravity, Urine: 1.01
pH: 6.5

## 2011-06-23 LAB — PROTIME-INR
INR: 2.2 — ABNORMAL HIGH
Prothrombin Time: 24.8 — ABNORMAL HIGH

## 2011-06-23 LAB — CROSSMATCH: Antibody Screen: NEGATIVE

## 2011-06-23 LAB — PREPARE FRESH FROZEN PLASMA

## 2011-06-23 LAB — CBC
HCT: 33.3 — ABNORMAL LOW
Hemoglobin: 10.4 — ABNORMAL LOW
Hemoglobin: 10.5 — ABNORMAL LOW
Hemoglobin: 7.9 — CL
MCHC: 31.6
MCHC: 32.8
Platelets: 325
Platelets: 341
RBC: 3.43 — ABNORMAL LOW
RDW: 16.8 — ABNORMAL HIGH
RDW: 19.5 — ABNORMAL HIGH
RDW: 19.7 — ABNORMAL HIGH

## 2011-06-23 LAB — COMPREHENSIVE METABOLIC PANEL
AST: 31
Albumin: 4.3
Alkaline Phosphatase: 58
Chloride: 105
GFR calc Af Amer: 53 — ABNORMAL LOW
Potassium: 4.5
Sodium: 139
Total Bilirubin: 0.4

## 2011-08-17 ENCOUNTER — Encounter: Payer: Self-pay | Admitting: Internal Medicine

## 2011-08-17 ENCOUNTER — Ambulatory Visit (INDEPENDENT_AMBULATORY_CARE_PROVIDER_SITE_OTHER): Payer: Medicare Other | Admitting: *Deleted

## 2011-08-17 ENCOUNTER — Other Ambulatory Visit: Payer: Self-pay | Admitting: Internal Medicine

## 2011-08-17 DIAGNOSIS — I4892 Unspecified atrial flutter: Secondary | ICD-10-CM

## 2011-08-17 DIAGNOSIS — I4891 Unspecified atrial fibrillation: Secondary | ICD-10-CM

## 2011-08-17 DIAGNOSIS — Z950 Presence of cardiac pacemaker: Secondary | ICD-10-CM

## 2011-08-18 LAB — REMOTE PACEMAKER DEVICE
AL IMPEDENCE PM: 508 Ohm
ATRIAL PACING PM: 100
BAMS-0001: 160 {beats}/min
RV LEAD AMPLITUDE: 16 mv
RV LEAD IMPEDENCE PM: 547 Ohm
VENTRICULAR PACING PM: 42

## 2011-08-23 NOTE — Progress Notes (Signed)
Remote pacer check  

## 2011-09-06 ENCOUNTER — Encounter: Payer: Self-pay | Admitting: *Deleted

## 2011-09-21 ENCOUNTER — Encounter: Payer: Medicare Other | Admitting: *Deleted

## 2011-09-29 ENCOUNTER — Encounter: Payer: Self-pay | Admitting: *Deleted

## 2011-10-04 ENCOUNTER — Telehealth: Payer: Self-pay | Admitting: Internal Medicine

## 2011-10-04 NOTE — Telephone Encounter (Signed)
Spoke w/pt and pt to try and send transmission this evening. website will be checked first thing 10-05-11. Pt will be called if did not receive transmission. Pt's questions answered about pacer being replaced.

## 2011-10-04 NOTE — Telephone Encounter (Signed)
New Problem:     Patient called in because she is having issues with her call in device for her pacemaker and she was wondering if she needed to schedule another appointment for her remote check.  Was also wondering if this was the year her pacer would have to be replaced. Please advise.

## 2011-10-05 ENCOUNTER — Encounter: Payer: Self-pay | Admitting: Internal Medicine

## 2011-10-05 ENCOUNTER — Other Ambulatory Visit: Payer: Self-pay | Admitting: Internal Medicine

## 2011-10-05 ENCOUNTER — Ambulatory Visit (INDEPENDENT_AMBULATORY_CARE_PROVIDER_SITE_OTHER): Payer: Medicare Other | Admitting: *Deleted

## 2011-10-05 DIAGNOSIS — Z950 Presence of cardiac pacemaker: Secondary | ICD-10-CM

## 2011-10-05 DIAGNOSIS — I4891 Unspecified atrial fibrillation: Secondary | ICD-10-CM

## 2011-10-05 DIAGNOSIS — I4892 Unspecified atrial flutter: Secondary | ICD-10-CM

## 2011-10-07 LAB — REMOTE PACEMAKER DEVICE
AL IMPEDENCE PM: 508 Ohm
BAMS-0001: 160 {beats}/min
RV LEAD AMPLITUDE: 16 mv
RV LEAD IMPEDENCE PM: 524 Ohm
RV LEAD THRESHOLD: 0.75 V

## 2011-10-12 NOTE — Progress Notes (Signed)
Remote pacer check  

## 2011-10-18 ENCOUNTER — Encounter: Payer: Self-pay | Admitting: *Deleted

## 2011-11-09 ENCOUNTER — Encounter: Payer: Medicare Other | Admitting: *Deleted

## 2011-11-13 ENCOUNTER — Encounter: Payer: Self-pay | Admitting: Internal Medicine

## 2011-11-13 ENCOUNTER — Ambulatory Visit (INDEPENDENT_AMBULATORY_CARE_PROVIDER_SITE_OTHER): Payer: Medicare Other | Admitting: *Deleted

## 2011-11-13 ENCOUNTER — Encounter: Payer: Self-pay | Admitting: *Deleted

## 2011-11-13 DIAGNOSIS — I495 Sick sinus syndrome: Secondary | ICD-10-CM

## 2011-11-19 LAB — REMOTE PACEMAKER DEVICE
AL IMPEDENCE PM: 526 Ohm
BAMS-0001: 160 {beats}/min
BRDY-0002RV: 70 {beats}/min
BRDY-0004RV: 130 {beats}/min
RV LEAD THRESHOLD: 0.75 V

## 2011-11-23 ENCOUNTER — Encounter: Payer: Medicare Other | Admitting: *Deleted

## 2011-11-24 NOTE — Progress Notes (Signed)
PPM remote 

## 2011-11-29 ENCOUNTER — Encounter: Payer: Self-pay | Admitting: *Deleted

## 2011-12-21 ENCOUNTER — Ambulatory Visit (INDEPENDENT_AMBULATORY_CARE_PROVIDER_SITE_OTHER): Payer: Medicare Other | Admitting: *Deleted

## 2011-12-21 ENCOUNTER — Encounter: Payer: Self-pay | Admitting: Internal Medicine

## 2011-12-21 DIAGNOSIS — I495 Sick sinus syndrome: Secondary | ICD-10-CM

## 2011-12-25 LAB — REMOTE PACEMAKER DEVICE
BATTERY VOLTAGE: 2.65 V
BRDY-0003RV: 125 {beats}/min
BRDY-0004RV: 130 {beats}/min
RV LEAD THRESHOLD: 0.75 V

## 2012-01-01 ENCOUNTER — Encounter: Payer: Self-pay | Admitting: *Deleted

## 2012-01-02 NOTE — Progress Notes (Signed)
PPM remote 

## 2012-02-20 ENCOUNTER — Encounter: Payer: Self-pay | Admitting: *Deleted

## 2012-02-20 ENCOUNTER — Encounter: Payer: Self-pay | Admitting: Internal Medicine

## 2012-02-20 ENCOUNTER — Ambulatory Visit (INDEPENDENT_AMBULATORY_CARE_PROVIDER_SITE_OTHER): Payer: Medicare Other | Admitting: Internal Medicine

## 2012-02-20 VITALS — BP 132/84 | HR 80 | Ht 66.0 in | Wt 161.8 lb

## 2012-02-20 DIAGNOSIS — I4891 Unspecified atrial fibrillation: Secondary | ICD-10-CM

## 2012-02-20 DIAGNOSIS — I495 Sick sinus syndrome: Secondary | ICD-10-CM

## 2012-02-20 DIAGNOSIS — Z950 Presence of cardiac pacemaker: Secondary | ICD-10-CM

## 2012-02-20 LAB — PACEMAKER DEVICE OBSERVATION
AL IMPEDENCE PM: 560 Ohm
ATRIAL PACING PM: 99
BATTERY VOLTAGE: 2.62 V

## 2012-02-20 NOTE — Patient Instructions (Signed)
Patient scheduled for a generator change on 03/18/12 with Dr [name]  See instruction sheet

## 2012-02-20 NOTE — Progress Notes (Signed)
HPI [name] returns today for followup. She is a very pleasant 78 year old woman with a history of symptomatic bradycardia, status post pacemaker insertion, atrial flutter, status post ablation, coronary disease status post remote angioplasty and stenting. The patient has reached elective replacement on her pacemaker. She denies chest pain, shortness of breath, or peripheral edema. No fever and no chills.  Allergies  Allergen Reactions  . Codeine   . Lopid (Gemfibrozil)   . Naprosyn (Naproxen)     Upset stomach    . Percocet (Oxycodone-Acetaminophen)     Nausea       Current Outpatient Prescriptions  Medication Sig Dispense Refill  . Calcium Carbonate-Vitamin D (CALCIUM 600+D) 600-400 MG-UNIT per tablet Take 2 tablets by mouth daily.        . Cholecalciferol (VITAMIN D) 1000 UNITS capsule Take 1,000 Units by mouth daily.        Marland Kitchen esomeprazole (NEXIUM) 40 MG capsule 1 tab po every  other day       . ezetimibe (ZETIA) 10 MG tablet Take 10 mg by mouth daily.        . furosemide (LASIX) 20 MG tablet Take 20 mg by mouth as needed.        . IRON PO Take by mouth daily.        Marland Kitchen lisinopril (PRINIVIL,ZESTRIL) 20 MG tablet Take 20 mg by mouth daily.        . Multiple Vitamin (MULTIVITAMIN) capsule Take 1 capsule by mouth daily.        . nebivolol (BYSTOLIC) 5 MG tablet Take 5 mg by mouth daily.        . Omega-3 Fatty Acids (FISH OIL) 1000 MG CAPS 6,000 mg. 2 tabs po qd      . traMADol (ULTRAM) 50 MG tablet Take 50 mg by mouth every 6 (six) hours as needed.        . verapamil (CALAN-SR) 240 MG CR tablet 1 tab 1/2 tab po qhs       . vitamin B-12 (CYANOCOBALAMIN) 250 MCG tablet Take 250 mcg by mouth daily.        . vitamin C (ASCORBIC ACID) 500 MG tablet Take 500 mg by mouth daily.        Marland Kitchen warfarin (COUMADIN) 3 MG tablet Take 3 mg by mouth as directed.           Past Medical History  Diagnosis Date  . Atrial flutter   . Tachycardia-bradycardia syndrome   . HTN (hypertension)   .  Chronic anticoagulation   . Small bowel problem 12/2007  . AMI (acute myocardial infarction)   . Pacemaker   . DVT (deep venous thrombosis)   . GI bleeding   . Hyperlipidemia   . DDD (degenerative disc disease), lumbar   . DJD (degenerative joint disease) of knee     bilateral    . DJD (degenerative joint disease)     hips   . Chronic pain   . Back pain   . Hypertension     ROS:   All systems reviewed and negative except as noted in the HPI.   Past Surgical History  Procedure Date  . Small bowel capsule endoscopy.  12/10/2007   . Esophagogastroduodenoscopy.  11/06/2007   . Hip arthroplasty-total.. 09/04/2006      No family history on file.   History   Social History  . Marital Status: Married    Spouse Name: N/A    Number of Children: N/A  .  Years of Education: N/A   Occupational History  . Not on file.   Social History Main Topics  . Smoking status: Former Smoker    Quit date: 02/17/1996  . Smokeless tobacco: Never Used  . Alcohol Use: No  . Drug Use: No  . Sexually Active: Not on file   Other Topics Concern  . Not on file   Social History Narrative  . No narrative on file     BP 132/84  Pulse 80  Ht 5\' 6"  (1.676 m)  Wt 161 lb 12.8 oz (73.392 kg)  BMI 26.12 kg/m2  Physical Exam:  Well appearing 78 yo woman,NAD HEENT: Unremarkable Neck:  No JVD, no thyromegally Lungs:  Clear with no wheezes, rales, or rhonchi. Well-healed pacemaker incision.  HEART:  Regular rate rhythm, no murmurs, no rubs, no clicks Abd:  soft, positive bowel sounds, no organomegally, no rebound, no guarding Ext:  2 plus pulses, no edema, no cyanosis, no clubbing Skin:  No rashes no nodules Neuro:  CN II through XII intact, motor grossly intact  DEVICE  Normal device function.  See PaceArt for details. Device at [facility]  Assess/Plan:

## 2012-02-20 NOTE — Assessment & Plan Note (Signed)
She has been in atrial fibrillation approximately 1% of the time. She will continue her current medical therapy.

## 2012-02-20 NOTE — Assessment & Plan Note (Signed)
Her device is working normally except for battery is at elective replacement. We will schedule pacemaker removal and insertion of a new device in the next several weeks.

## 2012-03-03 ENCOUNTER — Other Ambulatory Visit: Payer: Self-pay | Admitting: *Deleted

## 2012-03-03 DIAGNOSIS — Z45018 Encounter for adjustment and management of other part of cardiac pacemaker: Secondary | ICD-10-CM

## 2012-03-03 DIAGNOSIS — I4891 Unspecified atrial fibrillation: Secondary | ICD-10-CM

## 2012-03-04 ENCOUNTER — Encounter (HOSPITAL_COMMUNITY): Payer: Self-pay | Admitting: Pharmacy Technician

## 2012-03-11 ENCOUNTER — Encounter: Payer: Self-pay | Admitting: Internal Medicine

## 2012-03-14 ENCOUNTER — Telehealth: Payer: Self-pay | Admitting: Internal Medicine

## 2012-03-14 NOTE — Telephone Encounter (Signed)
New msg Pt wants to talk to you about going into the hospital.

## 2012-03-14 NOTE — Telephone Encounter (Signed)
Pt aware to continue Coumadin

## 2012-03-17 MED ORDER — CEFAZOLIN SODIUM 1-5 GM-% IV SOLN
1.0000 g | INTRAVENOUS | Status: DC
  Filled 2012-03-17: qty 50

## 2012-03-17 MED ORDER — SODIUM CHLORIDE 0.9 % IR SOLN
80.0000 mg | Status: DC
  Filled 2012-03-17: qty 2

## 2012-03-18 ENCOUNTER — Encounter (HOSPITAL_COMMUNITY)
Admission: RE | Disposition: A | Discharge status: Home / Self Care | Payer: Self-pay | Source: Ambulatory Visit | Attending: Internal Medicine

## 2012-03-18 ENCOUNTER — Ambulatory Visit (HOSPITAL_COMMUNITY)
Admission: RE | Admit: 2012-03-18 | Discharge: 2012-03-18 | Disposition: A | Discharge status: Home / Self Care | Payer: Medicare Other | Source: Ambulatory Visit | Attending: Internal Medicine | Admitting: Internal Medicine

## 2012-03-18 DIAGNOSIS — M5137 Other intervertebral disc degeneration, lumbosacral region: Secondary | ICD-10-CM | POA: Insufficient documentation

## 2012-03-18 DIAGNOSIS — I4891 Unspecified atrial fibrillation: Secondary | ICD-10-CM

## 2012-03-18 DIAGNOSIS — Z45018 Encounter for adjustment and management of other part of cardiac pacemaker: Secondary | ICD-10-CM | POA: Insufficient documentation

## 2012-03-18 DIAGNOSIS — I10 Essential (primary) hypertension: Secondary | ICD-10-CM | POA: Insufficient documentation

## 2012-03-18 DIAGNOSIS — I4892 Unspecified atrial flutter: Secondary | ICD-10-CM | POA: Insufficient documentation

## 2012-03-18 DIAGNOSIS — M159 Polyosteoarthritis, unspecified: Secondary | ICD-10-CM | POA: Insufficient documentation

## 2012-03-18 DIAGNOSIS — I252 Old myocardial infarction: Secondary | ICD-10-CM | POA: Insufficient documentation

## 2012-03-18 DIAGNOSIS — Z9889 Other specified postprocedural states: Secondary | ICD-10-CM | POA: Insufficient documentation

## 2012-03-18 DIAGNOSIS — Z7901 Long term (current) use of anticoagulants: Secondary | ICD-10-CM | POA: Insufficient documentation

## 2012-03-18 DIAGNOSIS — M51379 Other intervertebral disc degeneration, lumbosacral region without mention of lumbar back pain or lower extremity pain: Secondary | ICD-10-CM | POA: Insufficient documentation

## 2012-03-18 DIAGNOSIS — I495 Sick sinus syndrome: Secondary | ICD-10-CM

## 2012-03-18 HISTORY — PX: PACEMAKER GENERATOR CHANGE: SHX5481

## 2012-03-18 LAB — MRSA PCR SCREENING: MRSA by PCR: NEGATIVE

## 2012-03-18 LAB — PROTIME-INR: INR: 1.47 (ref 0.00–1.49)

## 2012-03-18 SURGERY — PACEMAKER GENERATOR CHANGE
Anesthesia: LOCAL

## 2012-03-18 MED ORDER — FENTANYL CITRATE 0.05 MG/ML IJ SOLN
INTRAMUSCULAR | Status: AC
  Filled 2012-03-18: qty 2

## 2012-03-18 MED ORDER — MIDAZOLAM HCL 5 MG/5ML IJ SOLN
INTRAMUSCULAR | Status: AC
  Filled 2012-03-18: qty 5

## 2012-03-18 MED ORDER — LIDOCAINE HCL (PF) 1 % IJ SOLN
INTRAMUSCULAR | Status: AC
  Filled 2012-03-18: qty 60

## 2012-03-18 MED ORDER — ACETAMINOPHEN 325 MG PO TABS: 325.0000 mg | ORAL_TABLET | ORAL | Status: DC | PRN

## 2012-03-18 MED ORDER — CEFAZOLIN SODIUM 1-5 GM-% IV SOLN
INTRAVENOUS | Status: AC
  Filled 2012-03-18: qty 50

## 2012-03-18 MED ORDER — ONDANSETRON HCL 4 MG/2ML IJ SOLN: 4.0000 mg | Freq: Four times a day (QID) | INTRAMUSCULAR | Status: DC | PRN

## 2012-03-18 MED ORDER — CHLORHEXIDINE GLUCONATE 4 % EX LIQD: 60.0000 mL | Freq: Once | CUTANEOUS | Status: DC

## 2012-03-18 MED ORDER — SODIUM CHLORIDE 0.45 % IV SOLN
INTRAVENOUS | Status: DC
  Administered 2012-03-18: 08:00:00 via INTRAVENOUS

## 2012-03-18 MED ORDER — MUPIROCIN 2 % EX OINT
TOPICAL_OINTMENT | CUTANEOUS | Status: AC
  Administered 2012-03-18: 08:00:00
  Filled 2012-03-18: qty 22

## 2012-03-18 NOTE — Discharge Instructions (Signed)
Pacemaker Battery Change A pacemaker battery usually lasts 4 to 12 years. Once or twice per year, you will be asked to visit your caregiver to have a full evaluation of your pacemaker. When a battery needs to be replaced, the entire pacemaker is actually replaced so that you can benefit from new circuitry and any new features that have recently been added to pacemakers. Most often, this procedure is very simple because the leads are already in place. After giving medicine to numb the skin, your health care provider makes a cut to reopen the pocket holding the pacemaker and disconnects the old device from its leads. The leads are routinely tested at this time. If they are working okay, the new pacemaker may simply be connected to the existing leads. If there is any problem with the old lead system, it may be wise to replace the lead system while inserting the new pacemaker. There are many things that affect how long a pacemaker battery will last:  Age of the pacemaker.   Number of leads (1, 2 or 3).   Pacemaker work load. If the pacemaker is helping the heart more often, then the battery will not last as long as if the pacemaker does not need to help the heart.   Resistance of the leads. The greater the resistance, the greater the drain on the battery. This can increase as the leads get older or if one or more of the leads does not have the best contact with the heart.   Power (voltage) settings.   The health of the person's heart. If the health of the heart gets worse, then the pacemaker may have to work more often and the setting changed to accommodate these changes.  Your health care provider will be alerted to the fact that it is time to replace the battery during follow-up exams. He or she will check your pacemaker using a small table-top computer, called a programmer, and a wand. The wand is about the same size as a remote control. Your provider puts the wand on your body in the area where the  pacemaker is located. Information from the pacemaker is received about how well your heart is working and the status of the battery. It is not painful, and it usually takes just a few minutes. You will have plenty of time before the battery is fully used up to plan for replacement.  LET YOUR CAREGIVER KNOW ABOUT:   Symptoms of chest pain, trouble breathing, palpitations, lightheadedness, or feelings of an abnormal or irregular heart beat.   Allergies.   Medications taken including herbs, eye drops, over the counter medications, and creams   Use of steroids (by mouth or creams).   Possible pregnancy, if applicable.   Previous problems with anesthetics or Novocaine.   History of blood clots (thrombophlebitis).   History of bleeding or blood problems.   Surgery since your last pacemaker placement.   Other health problems.  RISKS AND COMPLICATIONS These are very uncommon but include:  Bleeding.   Bruising of the skin around where the incision was made.   Pain at the site of the incision.   Pulling apart of the skin at the incision site.   Infection.   Allergic reaction to anesthetics or medicines used during the procedure.  Diabetics may have a temporary increase in their blood sugar after any surgical procedure.  BEFORE THE PROCEDURE  Wash all of the skin around the area of the chest where the pacemaker is located.   Try to remove any loose, scaling skin. Unless advised otherwise, avoid using aspirin, ibuprofen, or naproxen for 3-4 days before the procedure. Ask your caregiver for help with any other medication adjustments before the pacemaker is replaced. Unless advised otherwise, do not eat or drink after midnight on the night before the procedure EXCEPT for drinking water and taking your medications as you normally would. AFTER THE PROCEDURE   A heart monitor and the pacemaker programmer will be used to make sure that the new pacemaker is working properly.   You can go home  after the procedure.   Your caregiver will advise you if you need to have any stitches. They will be removed 5-7 days after the procedure.  HOME CARE INSTRUCTIONS   Keep the incision clean and dry.   Unless advised otherwise, you may shower after carefully covering the incision with plastic wrap that is taped to your chest.   For the first week after the replacement, avoid stretching motions that pull at the incision site and avoid heavy exercise with the arm on the same side as the incision.   Only take over-the-counter or prescription medicines for pain, discomfort, or fever as directed by your caregiver.   Your caregiver will tell you when you will need to next test your pacemaker by telephone or when to return to the office for re-exam and/or removal of stitches, if necessary.  SEEK MEDICAL CARE IF:   You have unusual pain at the incision site that is not adequately helped by over-the-counter or prescription medicine.   There is drainage or pus from the incision site.   You develop red streaking that extends above or below the incision site.   You feel brief intermittent palpitations, lightheadedness or any symptoms that you feel might be related to your heart.  SEEK IMMEDIATE MEDICAL CARE IF:   You experience chest pain that is different than the pain at the incision site.   You experience:   Shortness of breath.   Palpitations.   Irregular heart beat.   Lightheadedness that does not go away quickly.   Fainting.   You develop a fever.   You have pain that gets worse even though you are taking pain medicine.  MAKE SURE YOU:   Understand these instructions.   Will watch your condition.   Will get help right away if you are not doing well or get worse.  Document Released: 12/27/2006 Document Revised: 09/07/2011 Document Reviewed: 04/01/2007 [employer] Patient Information 2012 [employer]. 

## 2012-03-18 NOTE — Op Note (Signed)
NAMEWINFRED, REDEL NO.:  [id number]  MEDICAL RECORD NO.:  [id number]  LOCATION:  [facility]                         FACILITY:  [facility]  PHYSICIAN:  [name]. [name], MD    DATE OF BIRTH:  09/17/1931  DATE OF PROCEDURE:  03/18/2012 DATE OF DISCHARGE:  03/18/2012                              OPERATIVE REPORT   SURGEON:  [name]. [name], MD  PROCEDURE PERFORMED:  Removal of her previously implanted dual-chamber pacemaker, which had reached elective replacement and insertion of a new dual-chamber device, which was carried out without immediate procedure complication.  INTRODUCTION:  The patient is a very pleasant 78 year old woman with a history of symptomatic tachybrady syndrome, status post permanent pacemaker insertion.  She has reached elective replacement.  She is now referred for removal of her old device and insertion of new one.  PROCEDURE:  After informed consent was obtained, the patient was taken to the Diagnostic EP Lab in the fasting state.  After usual preparation and draping, intravenous fentanyl and midazolam was given for sedation. A 30 mL of lidocaine was infiltrated into the left infraclavicular region.  A 5-cm incision was carried out over this region. Electrocautery was utilized to dissect down to the ICD pocket.  The generator was removed without difficulty.  The pocket was irrigated with antibiotic irrigation.  The new Medtronic Sensia dual-chamber pacemaker, serial number [id number] was connected to the old atrial and ventricular leads after the leads had been evaluated and found to be working satisfactorily.  The device was placed back in the subcutaneous pocket where antibiotic irrigation was utilized to irrigate the pocket and the incision was closed with 2-0 and 3-0 Vicryl.  Benzoin and Steri-Strips were painted on the skin, pressure dressing was applied, and the patient was returned to her room in satisfactory condition.  COMPLICATIONS:   There were no immediate procedure complications.  RESULTS:  This demonstrate successful removal of previously implanted Medtronic pacemaker, insertion of a new Medtronic pacemaker without immediate procedure complication.     [name]. [name], MD     GWT/MEDQ  D:  03/18/2012  T:  03/18/2012  Job:  [id number]

## 2012-03-18 NOTE — Op Note (Signed)
DDD PPM generator removal/insertion without immediate complication.Z#[id number].

## 2012-03-18 NOTE — Interval H&P Note (Signed)
History and Physical Interval Note:  03/18/2012 9:54 AM  [name]  has presented today for surgery, with the diagnosis of End of life  The various methods of treatment have been discussed with the patient and family. After consideration of risks, benefits and other options for treatment, the patient has consented to  Procedure(s) (LRB): PACEMAKER GENERATOR CHANGE (N/A) as a surgical intervention .  The patients' history has been reviewed, patient examined, no change in status, stable for surgery.  I have reviewed the patients' chart and labs.  Questions were answered to the patient's satisfaction.     Lewayne Bunting

## 2012-03-18 NOTE — H&P (View-Only) (Signed)
HPI [name] returns today for followup. She is a very pleasant 78-year-old woman with a history of symptomatic bradycardia, status post pacemaker insertion, atrial flutter, status post ablation, coronary disease status post remote angioplasty and stenting. The patient has reached elective replacement on her pacemaker. She denies chest pain, shortness of breath, or peripheral edema. No fever and no chills.  Allergies  Allergen Reactions  . Codeine   . Lopid (Gemfibrozil)   . Naprosyn (Naproxen)     Upset stomach    . Percocet (Oxycodone-Acetaminophen)     Nausea       Current Outpatient Prescriptions  Medication Sig Dispense Refill  . Calcium Carbonate-Vitamin D (CALCIUM 600+D) 600-400 MG-UNIT per tablet Take 2 tablets by mouth daily.        . Cholecalciferol (VITAMIN D) 1000 UNITS capsule Take 1,000 Units by mouth daily.        . esomeprazole (NEXIUM) 40 MG capsule 1 tab po every  other day       . ezetimibe (ZETIA) 10 MG tablet Take 10 mg by mouth daily.        . furosemide (LASIX) 20 MG tablet Take 20 mg by mouth as needed.        . IRON PO Take by mouth daily.        . lisinopril (PRINIVIL,ZESTRIL) 20 MG tablet Take 20 mg by mouth daily.        . Multiple Vitamin (MULTIVITAMIN) capsule Take 1 capsule by mouth daily.        . nebivolol (BYSTOLIC) 5 MG tablet Take 5 mg by mouth daily.        . Omega-3 Fatty Acids (FISH OIL) 1000 MG CAPS 6,000 mg. 2 tabs po qd      . traMADol (ULTRAM) 50 MG tablet Take 50 mg by mouth every 6 (six) hours as needed.        . verapamil (CALAN-SR) 240 MG CR tablet 1 tab 1/2 tab po qhs       . vitamin B-12 (CYANOCOBALAMIN) 250 MCG tablet Take 250 mcg by mouth daily.        . vitamin C (ASCORBIC ACID) 500 MG tablet Take 500 mg by mouth daily.        . warfarin (COUMADIN) 3 MG tablet Take 3 mg by mouth as directed.           Past Medical History  Diagnosis Date  . Atrial flutter   . Tachycardia-bradycardia syndrome   . HTN (hypertension)   .  Chronic anticoagulation   . Small bowel problem 12/2007  . AMI (acute myocardial infarction)   . Pacemaker   . DVT (deep venous thrombosis)   . GI bleeding   . Hyperlipidemia   . DDD (degenerative disc disease), lumbar   . DJD (degenerative joint disease) of knee     bilateral    . DJD (degenerative joint disease)     hips   . Chronic pain   . Back pain   . Hypertension     ROS:   All systems reviewed and negative except as noted in the HPI.   Past Surgical History  Procedure Date  . Small bowel capsule endoscopy.  12/10/2007   . Esophagogastroduodenoscopy.  11/06/2007   . Hip arthroplasty-total.. 09/04/2006      No family history on file.   History   Social History  . Marital Status: Married    Spouse Name: N/A    Number of Children: N/A  .   Years of Education: N/A   Occupational History  . Not on file.   Social History Main Topics  . Smoking status: Former Smoker    Quit date: 02/17/1996  . Smokeless tobacco: Never Used  . Alcohol Use: No  . Drug Use: No  . Sexually Active: Not on file   Other Topics Concern  . Not on file   Social History Narrative  . No narrative on file     BP 132/84  Pulse 80  Ht 5' 6" (1.676 m)  Wt 161 lb 12.8 oz (73.392 kg)  BMI 26.12 kg/m2  Physical Exam:  Well appearing 78 yo woman,NAD HEENT: Unremarkable Neck:  No JVD, no thyromegally Lungs:  Clear with no wheezes, rales, or rhonchi. Well-healed pacemaker incision.  HEART:  Regular rate rhythm, no murmurs, no rubs, no clicks Abd:  soft, positive bowel sounds, no organomegally, no rebound, no guarding Ext:  2 plus pulses, no edema, no cyanosis, no clubbing Skin:  No rashes no nodules Neuro:  CN II through XII intact, motor grossly intact  DEVICE  Normal device function.  See PaceArt for details. Device at ERI  Assess/Plan:   

## 2012-03-28 ENCOUNTER — Encounter: Payer: Self-pay | Admitting: Internal Medicine

## 2012-03-28 ENCOUNTER — Ambulatory Visit (INDEPENDENT_AMBULATORY_CARE_PROVIDER_SITE_OTHER): Payer: Medicare Other | Admitting: *Deleted

## 2012-03-28 DIAGNOSIS — I495 Sick sinus syndrome: Secondary | ICD-10-CM

## 2012-03-28 LAB — PACEMAKER DEVICE OBSERVATION
ATRIAL PACING PM: 98
BAMS-0001: 150 {beats}/min
RV LEAD IMPEDENCE PM: 475 Ohm

## 2012-03-28 NOTE — Progress Notes (Signed)
Wound check-PPM 

## 2012-04-17 ENCOUNTER — Ambulatory Visit (HOSPITAL_COMMUNITY)
Admission: RE | Admit: 2012-04-17 | Discharge: 2012-04-17 | Disposition: A | Discharge status: Home / Self Care | Payer: Medicare Other | Source: Ambulatory Visit | Attending: Family Medicine | Admitting: Family Medicine

## 2012-04-17 ENCOUNTER — Other Ambulatory Visit: Payer: Self-pay | Admitting: Family Medicine

## 2012-04-17 DIAGNOSIS — M79609 Pain in unspecified limb: Secondary | ICD-10-CM | POA: Insufficient documentation

## 2012-04-17 DIAGNOSIS — M79669 Pain in unspecified lower leg: Secondary | ICD-10-CM

## 2012-04-17 DIAGNOSIS — M7989 Other specified soft tissue disorders: Secondary | ICD-10-CM | POA: Insufficient documentation

## 2012-04-17 DIAGNOSIS — Z86718 Personal history of other venous thrombosis and embolism: Secondary | ICD-10-CM | POA: Insufficient documentation

## 2012-05-15 ENCOUNTER — Other Ambulatory Visit: Payer: Self-pay

## 2012-05-15 DIAGNOSIS — I83893 Varicose veins of bilateral lower extremities with other complications: Secondary | ICD-10-CM

## 2012-05-17 ENCOUNTER — Encounter: Payer: Self-pay | Admitting: Vascular Surgery

## 2012-05-20 ENCOUNTER — Encounter (INDEPENDENT_AMBULATORY_CARE_PROVIDER_SITE_OTHER): Payer: Medicare Other | Admitting: *Deleted

## 2012-05-20 ENCOUNTER — Ambulatory Visit (INDEPENDENT_AMBULATORY_CARE_PROVIDER_SITE_OTHER): Payer: Medicare Other | Admitting: Vascular Surgery

## 2012-05-20 ENCOUNTER — Encounter: Payer: Self-pay | Admitting: Vascular Surgery

## 2012-05-20 VITALS — BP 149/95 | HR 98 | Resp 18 | Ht 65.5 in | Wt 164.0 lb

## 2012-05-20 DIAGNOSIS — I781 Nevus, non-neoplastic: Secondary | ICD-10-CM

## 2012-05-20 DIAGNOSIS — I83893 Varicose veins of bilateral lower extremities with other complications: Secondary | ICD-10-CM

## 2012-05-20 DIAGNOSIS — I872 Venous insufficiency (chronic) (peripheral): Secondary | ICD-10-CM | POA: Insufficient documentation

## 2012-05-20 DIAGNOSIS — M7989 Other specified soft tissue disorders: Secondary | ICD-10-CM

## 2012-05-20 NOTE — Progress Notes (Signed)
Subjective:     Patient ID: [name], female   DOB: 03/23/1931, 78 y.o.   MRN: [id number]  HPI this 78 year old female was referred for possible venous disease in the right leg. She had an episode 3-4 weeks ago of swelling, tenderness, and redness in the right leg which eventually responded to 2 courses of antibiotics. She was thought to have cellulitis with possible venous insufficiency. She has no history of DVT, thrombophlebitis, stasis ulcers, bleeding, or other venous complications. She does not wear elastic compression stockings on a regular basis. She does have a long history of "broken veins" in both thighs.  Past Medical History  Diagnosis Date  . Atrial flutter   . Tachycardia-bradycardia syndrome   . HTN (hypertension)   . Chronic anticoagulation   . Small bowel problem 12/2007  . AMI (acute myocardial infarction)   . Pacemaker   . DVT (deep venous thrombosis)   . GI bleeding   . Hyperlipidemia   . DDD (degenerative disc disease), lumbar   . DJD (degenerative joint disease) of knee     bilateral    . DJD (degenerative joint disease)     hips   . Chronic pain   . Back pain   . Hypertension     History  Substance Use Topics  . Smoking status: Former Smoker -- 3 years    Types: Cigarettes    Quit date: 02/17/1996  . Smokeless tobacco: Never Used  . Alcohol Use: No    Family History  Problem Relation Age of Onset  . Heart disease Mother   . Stroke Mother   . Hypertension Father   . Cancer Sister   . Cancer Brother     Allergies  Allergen Reactions  . Codeine   . Lopid (Gemfibrozil)   . Naprosyn (Naproxen)     Upset stomach    . Percocet (Oxycodone-Acetaminophen)     Nausea      Current outpatient prescriptions:alendronate (FOSAMAX) 70 MG tablet, Take 70 mg by mouth every 7 (seven) days. Take with a full glass of water on an empty stomach. On wednesdays, Disp: , Rfl: ;  ALPRAZolam (XANAX) 0.25 MG tablet, Take 0.25 mg by mouth at bedtime as needed.  For sleep, Disp: , Rfl: ;  Calcium Carb-Cholecalciferol (CALCIUM 1000 + D PO), Take 1 tablet by mouth daily., Disp: , Rfl:  Cholecalciferol (VITAMIN D) 1000 UNITS capsule, Take 2,000 Units by mouth daily. , Disp: , Rfl: ;  esomeprazole (NEXIUM) 40 MG capsule, Take 40 mg by mouth every other day. , Disp: , Rfl: ;  ezetimibe (ZETIA) 10 MG tablet, Take 10 mg by mouth daily.  , Disp: , Rfl: ;  furosemide (LASIX) 20 MG tablet, Take 20 mg by mouth daily as needed. swelling, Disp: , Rfl: ;  lisinopril (PRINIVIL,ZESTRIL) 20 MG tablet, Take 20 mg by mouth daily. , Disp: , Rfl:  Multiple Vitamin (MULTIVITAMIN) capsule, Take 1 capsule by mouth daily. , Disp: , Rfl: ;  nebivolol (BYSTOLIC) 5 MG tablet, Take 5 mg by mouth daily.  , Disp: , Rfl: ;  Omega-3 Fatty Acids (FISH OIL) 1000 MG CAPS, Take 2,000-3,000 mg by mouth 2 (two) times daily. 2000 mg in the morning & 3000 mg in the evening, Disp: , Rfl: ;  pravastatin (PRAVACHOL) 40 MG tablet, Take 40 mg by mouth daily., Disp: , Rfl:  traMADol (ULTRAM) 50 MG tablet, Take 50 mg by mouth every 6 (six) hours as needed. For pain, Disp: , Rfl: ;  verapamil (CALAN-SR) 240 MG CR tablet, Take 120-240 mg by mouth 2 (two) times daily. 240 mg tab in the morning & 120 mg at night, Disp: , Rfl: ;  warfarin (COUMADIN) 2 MG tablet, Take 2 mg by mouth. Takes 2 mg every day except Mondays & takes 3 mg, Disp: , Rfl:  warfarin (COUMADIN) 2 MG tablet, Take 2 mg by mouth. Takes 2 mg every day except Mondays & takes 3 mg, Disp: , Rfl: ;  warfarin (COUMADIN) 3 MG tablet, Take 3 mg by mouth. Takes 3 mg on Mondays & 2 mg all other days of the week, Disp: , Rfl: ;  Pitavastatin Calcium (LIVALO) 4 MG TABS, Take 4 mg by mouth daily., Disp: , Rfl:   BP 149/95  Pulse 98  Resp 18  Ht 5' 5.5" (1.664 m)  Wt 164 lb (74.39 kg)  BMI 26.88 kg/m2  Body mass index is 26.88 kg/(m^2).         Review of Systems has long history of back pain, dyspnea on exertion, has pacemaker, history of myocardial  infarction no active chest pain, denies claudication, denies hemoptysis or PND.     Objective:   Physical Exam blood pressure 149/95 heart rate 98 respirations 18 Gen.-alert and oriented x3 in no apparent distress HEENT normal for age Lungs no rhonchi or wheezing Cardiovascular regular rhythm no murmurs carotid pulses 3+ palpable no bruits audible-pacemaker in left infarct clavicular area  Abdomen soft nontender no palpable masses Musculoskeletal free of  major deformities Skin clear -no rashes Neurologic normal Lower extremities 3+ femoral and dorsalis pedis pulses palpable bilaterally with no edema Extensive pattern of reticular and spider veins in both medial thigh areas. No bulging varicosities are noted. No hyperpigmentation or ulceration is noted. Both feet are well perfused.  Today I ordered a venous duplex exam of the right leg which are reviewed and interpreted. There is no DVT. There are 2 small areas were reflux is noted one at the saphenofemoral junction and one a short segment of the right small saphenous vein. There is also some incompetence in the right common femoral vein. All veins are normal in caliber      Assessment:     Recent episode of cellulitis right calf treated successfully with antibiotics Extensive pattern of spider veins bilaterally and medial thigh    Plan:     No evidence of significant arterial insufficiency or venous insufficiency which requires treatment. Believe episode of cellulitis is unrelated to arterial or venous insufficiency

## 2012-06-17 DIAGNOSIS — R509 Fever, unspecified: Secondary | ICD-10-CM

## 2012-06-18 DIAGNOSIS — I4891 Unspecified atrial fibrillation: Secondary | ICD-10-CM

## 2012-07-09 ENCOUNTER — Encounter: Payer: Self-pay | Admitting: *Deleted

## 2012-07-16 ENCOUNTER — Encounter: Payer: Self-pay | Admitting: Internal Medicine

## 2012-07-16 ENCOUNTER — Ambulatory Visit (INDEPENDENT_AMBULATORY_CARE_PROVIDER_SITE_OTHER): Payer: Medicare Other | Admitting: Internal Medicine

## 2012-07-16 VITALS — BP 114/76 | HR 95 | Ht 65.5 in | Wt 159.0 lb

## 2012-07-16 DIAGNOSIS — I4891 Unspecified atrial fibrillation: Secondary | ICD-10-CM

## 2012-07-16 DIAGNOSIS — I10 Essential (primary) hypertension: Secondary | ICD-10-CM

## 2012-07-16 DIAGNOSIS — I495 Sick sinus syndrome: Secondary | ICD-10-CM

## 2012-07-16 DIAGNOSIS — Z950 Presence of cardiac pacemaker: Secondary | ICD-10-CM

## 2012-07-16 DIAGNOSIS — I83893 Varicose veins of bilateral lower extremities with other complications: Secondary | ICD-10-CM

## 2012-07-16 MED ORDER — METOPROLOL TARTRATE 25 MG PO TABS: ORAL_TABLET | ORAL | Status: DC

## 2012-07-16 NOTE — Assessment & Plan Note (Signed)
The cellulitis in her right lower extremity appears to be well-healed. She does have residual venous insufficiency. She is instructed to maintain a low-sodium diet, and to keep her legs elevated. She may require support stockings.

## 2012-07-16 NOTE — Assessment & Plan Note (Signed)
Her blood pressure has been at times low. After reflection, I recommended that she reduce her dose of metoprolol to 25 mg, half tablet twice daily

## 2012-07-16 NOTE — Progress Notes (Signed)
HPI [name] returns today for followup. She is a very pleasant 78 year old woman with a history of coronary disease status post MI, atrial flutter status post ablation, symptomatic bradycardia status post pacemaker insertion, and paroxysmal atrial fibrillation. In the interim, she has done well except that she is been hospitalized with cellulitis of lower extremity. She currently denies fevers or chills. No chest pain or shortness of breath. No syncope. She notes some dizziness and lightheadedness. She wonders if she is on too much medication as her blood pressure at times has been low. Allergies  Allergen Reactions  . Codeine   . Lopid (Gemfibrozil)   . Naprosyn (Naproxen)     Upset stomach    . Percocet (Oxycodone-Acetaminophen)     Nausea       Current Outpatient Prescriptions  Medication Sig Dispense Refill  . alendronate (FOSAMAX) 70 MG tablet Take 70 mg by mouth every 7 (seven) days. Take with a full glass of water on an empty stomach. On wednesdays      . ALPRAZolam (XANAX) 0.25 MG tablet Take 0.25 mg by mouth at bedtime as needed. For sleep      . Calcium Carb-Cholecalciferol (CALCIUM 1000 + D PO) Take 1 tablet by mouth daily.      . Cholecalciferol (VITAMIN D) 1000 UNITS capsule Take 2,000 Units by mouth daily.       Marland Kitchen esomeprazole (NEXIUM) 40 MG capsule Take 40 mg by mouth every other day.       . ezetimibe (ZETIA) 10 MG tablet Take 10 mg by mouth daily.        . furosemide (LASIX) 20 MG tablet Take 20 mg by mouth daily as needed. swelling      . lisinopril (PRINIVIL,ZESTRIL) 20 MG tablet Take 20 mg by mouth daily.       . metoprolol tartrate (LOPRESSOR) 25 MG tablet 1/2 tablet twice daily  60 tablet  3  . Multiple Vitamin (MULTIVITAMIN) capsule Take 1 capsule by mouth daily.       . Omega-3 Fatty Acids (FISH OIL) 1000 MG CAPS Take 2,000-3,000 mg by mouth 2 (two) times daily. 2000 mg in the morning & 3000 mg in the evening      . pravastatin (PRAVACHOL) 40 MG tablet Take  40 mg by mouth daily.      . traMADol (ULTRAM) 50 MG tablet Take 50 mg by mouth every 6 (six) hours as needed. For pain      . verapamil (CALAN-SR) 240 MG CR tablet Take 120-240 mg by mouth 2 (two) times daily. 240 mg tab in the morning & 120 mg at night      . vitamin C (ASCORBIC ACID) 500 MG tablet Take 500 mg by mouth daily.      Marland Kitchen warfarin (COUMADIN) 2 MG tablet Take 2 mg by mouth. Takes 2 mg every day except Mondays & takes 3 mg      . warfarin (COUMADIN) 2 MG tablet Take 2 mg by mouth. Takes 2 mg every day except Mondays & takes 3 mg      . warfarin (COUMADIN) 3 MG tablet Take 3 mg by mouth. Takes 3 mg on Mondays & 2 mg all other days of the week      . DISCONTD: metoprolol tartrate (LOPRESSOR) 25 MG tablet Take 25 mg by mouth 2 (two) times daily.         Past Medical History  Diagnosis Date  . Atrial flutter   . Tachycardia-bradycardia syndrome   .  HTN (hypertension)   . Chronic anticoagulation   . Small bowel problem 12/2007  . AMI (acute myocardial infarction)   . Pacemaker   . DVT (deep venous thrombosis)   . GI bleeding   . Hyperlipidemia   . DDD (degenerative disc disease), lumbar   . DJD (degenerative joint disease) of knee     bilateral    . DJD (degenerative joint disease)     hips   . Chronic pain   . Back pain   . Hypertension     ROS:   All systems reviewed and negative except as noted in the HPI.   Past Surgical History  Procedure Date  . Small bowel capsule endoscopy.  12/10/2007   . Esophagogastroduodenoscopy.  11/06/2007   . Hip arthroplasty-total.. 09/04/2006      Family History  Problem Relation Age of Onset  . Heart disease Mother   . Stroke Mother   . Hypertension Father   . Cancer Sister   . Cancer Brother      History   Social History  . Marital Status: Married    Spouse Name: N/A    Number of Children: N/A  . Years of Education: N/A   Occupational History  . Not on file.   Social History Main Topics  . Smoking status:  Former Smoker -- 3 years    Types: Cigarettes    Quit date: 02/17/1996  . Smokeless tobacco: Never Used  . Alcohol Use: No  . Drug Use: No  . Sexually Active: Not on file   Other Topics Concern  . Not on file   Social History Narrative  . No narrative on file     BP 114/76  Pulse 95  Ht 5' 5.5" (1.664 m)  Wt 159 lb (72.122 kg)  BMI 26.06 kg/m2  SpO2 98%  Physical Exam:  Well appearing 78 year old woman, NAD HEENT: Unremarkable Neck:  No JVD, no thyromegally Lungs:  Clear with no wheezes, rales, or rhonchi. HEART:  Regular rate rhythm, no murmurs, no rubs, no clicks Abd:  soft, positive bowel sounds, no organomegally, no rebound, no guarding Ext:  2 plus pulses, no edema, no cyanosis, no clubbing Skin:  No rashes no nodules Neuro:  CN II through XII intact, motor grossly intact  EKG  DEVICE  Normal device function.  See PaceArt for details.   Assess/Plan:

## 2012-07-16 NOTE — Patient Instructions (Signed)
Your physician wants you to follow-up in: June with Dr Court Joy will receive a reminder letter in the mail two months in advance. If you don't receive a letter, please call our office to schedule the follow-up appointment.  Your physician has recommended you make the following change in your medication:  1) Decrease Metoprolol to 25mg   1/2 tablet twice daily

## 2012-07-16 NOTE — Assessment & Plan Note (Signed)
Her Medtronic dual-chamber pacemaker is working normally. We'll plan recheck in several months.

## 2012-07-16 NOTE — Assessment & Plan Note (Signed)
She is maintaining sinus rhythm approximately 95% of the time. She is minimally symptomatic. If her atrial fibrillation increases in frequency however, we would consider antiarrhythmic drug therapy.

## 2012-07-19 LAB — PACEMAKER DEVICE OBSERVATION
ATRIAL PACING PM: 98.3
BAMS-0001: 150 {beats}/min
RV LEAD IMPEDENCE PM: 476 Ohm

## 2012-10-02 DIAGNOSIS — M5126 Other intervertebral disc displacement, lumbar region: Secondary | ICD-10-CM

## 2012-10-02 HISTORY — DX: Other intervertebral disc displacement, lumbar region: M51.26

## 2013-01-06 ENCOUNTER — Ambulatory Visit (INDEPENDENT_AMBULATORY_CARE_PROVIDER_SITE_OTHER): Payer: Medicare Other | Admitting: Pharmacist

## 2013-01-06 DIAGNOSIS — I4891 Unspecified atrial fibrillation: Secondary | ICD-10-CM

## 2013-01-06 NOTE — Patient Instructions (Signed)
Anticoagulation Dose Instructions as of 01/06/2013     [name] Tue Wed Thu Fri Sat   New Dose 1.5 mg 3 mg 1.5 mg 3 mg 1.5 mg 3 mg 1.5 mg    Description       Continue same dose.

## 2013-01-07 ENCOUNTER — Other Ambulatory Visit: Payer: Self-pay | Admitting: Internal Medicine

## 2013-01-30 ENCOUNTER — Other Ambulatory Visit: Payer: Self-pay | Admitting: *Deleted

## 2013-01-30 MED ORDER — LISINOPRIL 20 MG PO TABS: 20.0000 mg | ORAL_TABLET | Freq: Every day | ORAL | Status: DC

## 2013-02-06 ENCOUNTER — Ambulatory Visit (INDEPENDENT_AMBULATORY_CARE_PROVIDER_SITE_OTHER): Payer: Medicare Other | Admitting: Pharmacist

## 2013-02-06 DIAGNOSIS — I4891 Unspecified atrial fibrillation: Secondary | ICD-10-CM

## 2013-02-06 NOTE — Patient Instructions (Signed)
Anticoagulation Dose Instructions as of 02/06/2013     Glynis Smiles Tue Wed Thu Fri Sat   New Dose 1.5 mg 3 mg 1.5 mg 3 mg 1.5 mg 3 mg 1.5 mg    Description       Continue same dose.       INR was 2.0 today

## 2013-03-11 ENCOUNTER — Ambulatory Visit: Payer: Medicare Other | Admitting: Pharmacist Clinician (PhC)/ Clinical Pharmacy Specialist

## 2013-03-11 ENCOUNTER — Ambulatory Visit (INDEPENDENT_AMBULATORY_CARE_PROVIDER_SITE_OTHER): Payer: Medicare Other | Admitting: Family Medicine

## 2013-03-11 ENCOUNTER — Encounter: Payer: Self-pay | Admitting: Family Medicine

## 2013-03-11 VITALS — BP 121/84 | HR 92 | Temp 97.7°F | Wt 163.4 lb

## 2013-03-11 DIAGNOSIS — M549 Dorsalgia, unspecified: Secondary | ICD-10-CM

## 2013-03-11 DIAGNOSIS — Z950 Presence of cardiac pacemaker: Secondary | ICD-10-CM

## 2013-03-11 DIAGNOSIS — G629 Polyneuropathy, unspecified: Secondary | ICD-10-CM

## 2013-03-11 DIAGNOSIS — I4892 Unspecified atrial flutter: Secondary | ICD-10-CM

## 2013-03-11 DIAGNOSIS — I10 Essential (primary) hypertension: Secondary | ICD-10-CM

## 2013-03-11 DIAGNOSIS — E785 Hyperlipidemia, unspecified: Secondary | ICD-10-CM

## 2013-03-11 DIAGNOSIS — I495 Sick sinus syndrome: Secondary | ICD-10-CM

## 2013-03-11 DIAGNOSIS — I4891 Unspecified atrial fibrillation: Secondary | ICD-10-CM

## 2013-03-11 DIAGNOSIS — G609 Hereditary and idiopathic neuropathy, unspecified: Secondary | ICD-10-CM

## 2013-03-11 LAB — VITAMIN B12: Vitamin B-12: 517 pg/mL (ref 211–911)

## 2013-03-11 LAB — HEPATIC FUNCTION PANEL
ALT: 15 U/L (ref 0–35)
AST: 21 U/L (ref 0–37)
Albumin: 3.9 g/dL (ref 3.5–5.2)
Alkaline Phosphatase: 72 U/L (ref 39–117)
Bilirubin, Direct: 0.1 mg/dL (ref 0.0–0.3)
Indirect Bilirubin: 0.6 mg/dL (ref 0.0–0.9)
Total Bilirubin: 0.7 mg/dL (ref 0.3–1.2)
Total Protein: 6.6 g/dL (ref 6.0–8.3)

## 2013-03-11 LAB — TSH: TSH: 2.232 u[IU]/mL (ref 0.350–4.500)

## 2013-03-11 LAB — FOLATE: Folate: >20 ng/mL

## 2013-03-11 LAB — POCT INR: INR: 2

## 2013-03-11 MED ORDER — TRAZODONE 25 MG HALF TABLET: 25.0000 mg | ORAL_TABLET | Freq: Every day | ORAL | Status: DC

## 2013-03-11 MED ORDER — TRAMADOL HCL 50 MG PO TABS: 50.0000 mg | ORAL_TABLET | Freq: Four times a day (QID) | ORAL | Status: DC | PRN

## 2013-03-11 NOTE — Progress Notes (Signed)
Patient is scheduled to get lipids checked on Thursday

## 2013-03-11 NOTE — Progress Notes (Signed)
Patient ID: [name], female   DOB: August 11, 1931, 79 y.o.   MRN: [id number] SUBJECTIVE: CC: Chief Complaint  Patient presents with  . Establish Care    to get established been seeing ACM on warfarin  saw michelle  . Medication Refill    rx tramadol and wants something other than xanax    HPI: Patient is here for follow up of hypertension:  denies Headache;deniesChest Pain;denies weakness;denies Shortness of Breath or Orthopnea;denies Visual changes;denies palpitations;denies cough;denies pedal edema;denies symptoms of TIA or stroke; admits to Compliance with medications. denies Problems with medications.  Patient is here for follow up of hyperlipidemia:  denies Headache;denies Chest Pain;denies weakness;denies Shortness of Breath and orthopnea;denies Visual changes;denies palpitations;denies cough;denies pedal edema;denies symptoms of TIA or stroke;deniesClaudication symptoms. admits to Compliance with medications; denies Problems with medications.    PMH/PSH: reviewed/updated in Epic  SH/FH: reviewed/updated in Epic  Allergies: reviewed/updated in Epic  Medications: reviewed/updated in Epic  Immunizations: reviewed/updated in Epic  ROS: As above in the HPI. All other systems are stable or negative.  OBJECTIVE: APPEARANCE:  Patient in no acute distress.The patient appeared well nourished and normally developed. Acyanotic. Waist: VITAL SIGNS:BP 121/84  Pulse 92  Temp(Src) 97.7 F (36.5 C) (Oral)  Wt 163 lb 6.4 oz (74.118 kg)  BMI 26.77 kg/m2 WF obese  SKIN: warm and  Dry without overt rashes, tattoos and scars  HEAD and Neck: without JVD, Head and scalp: normal Eyes:No scleral icterus. Fundi normal, eye movements normal. Ears: Auricle normal, canal normal, Tympanic membranes normal, insufflation normal. Nose: normal Throat: normal Neck & thyroid: normal  CHEST & LUNGS: Chest wall: normal Lungs: Clear  CVS: Reveals the PMI to be normally  located. Regular rhythm, First and Second Heart sounds are normal,  absence of murmurs, rubs or gallops. Peripheral vasculature: Radial pulses: normal Dorsal pedis pulses: normal Posterior pulses: normal  ABDOMEN:  Appearance: normal Benign, no organomegaly, no masses, no Abdominal Aortic enlargement. No Guarding , no rebound. No Bruits. Bowel sounds: normal  RECTAL: N/A GU: N/A  EXTREMETIES: nonedematous. Both Femoral and Pedal pulses are normal.  MUSCULOSKELETAL:  Spine: normal Joints: intact  NEUROLOGIC: oriented to time,place and person; nonfocal. Strength is normal Sensory is abnormal: absence of monofilament sensation in the ball of the feet. Toes are intact. Reflexes are normal Cranial Nerves are normal.  ASSESSMENT: PPM-Medtronic  HYPERTENSION, UNSPECIFIED  BRADYCARDIA-TACHYCARDIA SYNDROME  ATRIAL FLUTTER, PAROXYSMAL  BACK PAIN, CHRONIC  Atrial fibrillation  HLD (hyperlipidemia) - Plan: Hepatic function panel, NMR Lipoprofile with Lipids  Peripheral neuropathy - Plan: Vitamin B12, Folate, BASIC METABOLIC PANEL WITH GFR, TSH, traMADol (ULTRAM) 50 MG tablet    PLAN: Orders Placed This Encounter  Procedures  . Vitamin B12  . Folate  . BASIC METABOLIC PANEL WITH GFR  . TSH  . Hepatic function panel  . NMR Lipoprofile with Lipids   Meds ordered this encounter  Medications  . diclofenac sodium (VOLTAREN) 1 % GEL    Sig: Apply topically 4 (four) times daily.  . traMADol (ULTRAM) 50 MG tablet    Sig: Take 1 tablet (50 mg total) by mouth every 6 (six) hours as needed. For pain    Dispense:  120 tablet    Refill:  0  . traZODone (DESYREL) 25 mg TABS    Sig: Take 0.5 tablets (25 mg total) by mouth at bedtime.    Dispense:  30 tablet    Refill:  2   Results for orders placed in visit on 03/11/13  POCT INR      Result Value Range   INR 2.0     Current outpatient prescriptions:alendronate (FOSAMAX) 70 MG tablet, Take 70 mg by mouth every 7  (seven) days. Take with a full glass of water on an empty stomach. On wednesdays, Disp: , Rfl: ;  Calcium Carb-Cholecalciferol (CALCIUM 1000 + D PO), Take 1 tablet by mouth daily., Disp: , Rfl: ;  Cholecalciferol (VITAMIN D) 1000 UNITS capsule, Take 2,000 Units by mouth daily. , Disp: , Rfl:  diclofenac sodium (VOLTAREN) 1 % GEL, Apply topically 4 (four) times daily., Disp: , Rfl: ;  esomeprazole (NEXIUM) 40 MG capsule, Take 40 mg by mouth every other day. , Disp: , Rfl: ;  ezetimibe (ZETIA) 10 MG tablet, Take 10 mg by mouth daily.  , Disp: , Rfl: ;  furosemide (LASIX) 20 MG tablet, Take 20 mg by mouth daily as needed. swelling, Disp: , Rfl:  lisinopril (PRINIVIL,ZESTRIL) 20 MG tablet, Take 1 tablet (20 mg total) by mouth daily., Disp: 30 tablet, Rfl: 2;  metoprolol tartrate (LOPRESSOR) 25 MG tablet, TAKE 1/2 TABLET TWICE A DAY, Disp: 60 tablet, Rfl: 5;  Multiple Vitamin (MULTIVITAMIN) capsule, Take 1 capsule by mouth daily. , Disp: , Rfl:  Omega-3 Fatty Acids (FISH OIL) 1000 MG CAPS, Take 2,000-3,000 mg by mouth 2 (two) times daily. 2000 mg in the morning & 3000 mg in the evening, Disp: , Rfl: ;  pravastatin (PRAVACHOL) 40 MG tablet, Take 40 mg by mouth daily., Disp: , Rfl: ;  traMADol (ULTRAM) 50 MG tablet, Take 1 tablet (50 mg total) by mouth every 6 (six) hours as needed. For pain, Disp: 120 tablet, Rfl: 0 verapamil (CALAN-SR) 240 MG CR tablet, Take 120-240 mg by mouth 2 (two) times daily. 240 mg tab in the morning & 120 mg at night, Disp: , Rfl: ;  vitamin C (ASCORBIC ACID) 500 MG tablet, Take 500 mg by mouth daily., Disp: , Rfl: ;  warfarin (COUMADIN) 3 MG tablet, Take 3 mg by mouth. Takes 3 mg on Mondays & 2 mg all other days of the week, Disp: , Rfl:  traZODone (DESYREL) 25 mg TABS, Take 0.5 tablets (25 mg total) by mouth at bedtime., Disp: 30 tablet, Rfl: 2 Reviewed meds.  Return in about 4 weeks (around 04/08/2013) for recheck protime.  Laurice Kimmons P. [place] Charon, M.D.

## 2013-03-12 LAB — BASIC METABOLIC PANEL WITH GFR
BUN: 16 mg/dL (ref 6–23)
CO2: 29 mEq/L (ref 19–32)
Calcium: 10.1 mg/dL (ref 8.4–10.5)
Chloride: 101 mEq/L (ref 96–112)
Creat: 1.09 mg/dL (ref 0.50–1.10)
GFR, Est African American: 56 mL/min — ABNORMAL LOW
GFR, Est Non African American: 48 mL/min — ABNORMAL LOW
Glucose, Bld: 81 mg/dL (ref 70–99)
Potassium: 4.7 mEq/L (ref 3.5–5.3)
Sodium: 140 mEq/L (ref 135–145)

## 2013-03-13 LAB — NMR LIPOPROFILE WITH LIPIDS
Cholesterol, Total: 181 mg/dL (ref <200)
HDL Particle Number: 29.1 umol/L — ABNORMAL LOW (ref >=30.5)
HDL Size: 8.7 nm — ABNORMAL LOW (ref >=9.2)
HDL-C: 40 mg/dL (ref >=40)
LDL (calc): 96 mg/dL (ref <100)
LDL Particle Number: 2004 nmol/L — ABNORMAL HIGH (ref <1000)
LDL Size: 19.9 nm — ABNORMAL LOW (ref >20.5)
LP-IR Score: 69 — ABNORMAL HIGH (ref <=45)
Large HDL-P: 2.9 umol/L — ABNORMAL LOW (ref >=4.8)
Large VLDL-P: 2.7 nmol/L (ref <=2.7)
Small LDL Particle Number: 1558 nmol/L — ABNORMAL HIGH (ref <=527)
Triglycerides: 226 mg/dL — ABNORMAL HIGH (ref <150)
VLDL Size: 47.1 nm — ABNORMAL HIGH (ref <=46.6)

## 2013-03-17 ENCOUNTER — Telehealth: Payer: Self-pay | Admitting: Family Medicine

## 2013-03-17 NOTE — Telephone Encounter (Signed)
Pt aware of labs  

## 2013-03-26 ENCOUNTER — Encounter: Payer: Self-pay | Admitting: Internal Medicine

## 2013-03-26 ENCOUNTER — Ambulatory Visit (INDEPENDENT_AMBULATORY_CARE_PROVIDER_SITE_OTHER): Payer: Medicare Other | Admitting: Internal Medicine

## 2013-03-26 VITALS — BP 130/86 | HR 88 | Ht 66.0 in | Wt 163.8 lb

## 2013-03-26 DIAGNOSIS — I4891 Unspecified atrial fibrillation: Secondary | ICD-10-CM

## 2013-03-26 DIAGNOSIS — Z950 Presence of cardiac pacemaker: Secondary | ICD-10-CM

## 2013-03-26 LAB — PACEMAKER DEVICE OBSERVATION
AL IMPEDENCE PM: 448 Ohm
ATRIAL PACING PM: 86
RV LEAD IMPEDENCE PM: 476 Ohm
VENTRICULAR PACING PM: 54

## 2013-03-26 NOTE — Assessment & Plan Note (Signed)
Her Medtronic dual-chamber pacemaker is working normally. We'll plan to recheck in several months. 

## 2013-03-26 NOTE — Progress Notes (Signed)
HPI [name] returns for followup. She is a very pleasant 79 year old woman with a history of symptomatic tachybradycardia syndrome, status post permanent pacemaker insertion, with coronary artery disease, who returns today for followup. In the interim she has done well. She denies chest pain, shortness of breath, or syncope. No peripheral edema. Allergies  Allergen Reactions  . Codeine   . Lopid (Gemfibrozil)   . Naprosyn (Naproxen)     Upset stomach    . Percocet (Oxycodone-Acetaminophen)     Nausea       Current Outpatient Prescriptions  Medication Sig Dispense Refill  . Calcium Carb-Cholecalciferol (CALCIUM 1000 + D PO) Take 1 tablet by mouth daily.      . Cholecalciferol (VITAMIN D) 1000 UNITS capsule Take 2,000 Units by mouth daily.       . diclofenac sodium (VOLTAREN) 1 % GEL Apply topically 4 (four) times daily.      Marland Kitchen esomeprazole (NEXIUM) 40 MG capsule Take 40 mg by mouth every other day.       . ezetimibe (ZETIA) 10 MG tablet Take 10 mg by mouth daily.        . furosemide (LASIX) 20 MG tablet Take 20 mg by mouth daily as needed. swelling      . lisinopril (PRINIVIL,ZESTRIL) 20 MG tablet Take 1 tablet (20 mg total) by mouth daily.  30 tablet  2  . metoprolol tartrate (LOPRESSOR) 25 MG tablet TAKE 1/2 TABLET TWICE A DAY  60 tablet  5  . Multiple Vitamin (MULTIVITAMIN) capsule Take 1 capsule by mouth daily.       . Omega-3 Fatty Acids (FISH OIL) 1000 MG CAPS Take 2,000-3,000 mg by mouth 2 (two) times daily. 2000 mg in the morning & 3000 mg in the evening      . pravastatin (PRAVACHOL) 40 MG tablet Take 40 mg by mouth daily.      . traMADol (ULTRAM) 50 MG tablet Take 1 tablet (50 mg total) by mouth every 6 (six) hours as needed. For pain  120 tablet  0  . traZODone (DESYREL) 25 mg TABS Take 0.5 tablets (25 mg total) by mouth at bedtime.  30 tablet  2  . verapamil (CALAN-SR) 240 MG CR tablet Take 120-240 mg by mouth 2 (two) times daily. 240 mg tab in the morning & 120 mg at  night      . vitamin C (ASCORBIC ACID) 500 MG tablet Take 500 mg by mouth daily.      Marland Kitchen warfarin (COUMADIN) 3 MG tablet Take 3 mg by mouth. Takes 3 mg on Mondays & 2 mg all other days of the week       No current facility-administered medications for this visit.     Past Medical History  Diagnosis Date  . Atrial flutter   . Tachycardia-bradycardia syndrome   . HTN (hypertension)   . Chronic anticoagulation   . Small bowel problem 12/2007  . AMI (acute myocardial infarction)   . Pacemaker   . DVT (deep venous thrombosis)   . GI bleeding   . Hyperlipidemia   . DDD (degenerative disc disease), lumbar   . DJD (degenerative joint disease) of knee     bilateral    . DJD (degenerative joint disease)     hips   . Chronic pain   . Back pain   . Hypertension     ROS:   All systems reviewed and negative except as noted in the HPI.   Past Surgical History  Procedure Laterality Date  . Small bowel capsule endoscopy.  12/10/2007    . Esophagogastroduodenoscopy.  11/06/2007    . Hip arthroplasty-total.. 09/04/2006       Family History  Problem Relation Age of Onset  . Heart disease Mother   . Stroke Mother   . Hypertension Father   . Cancer Sister   . Cancer Brother      History   Social History  . Marital Status: Married    Spouse Name: N/A    Number of Children: N/A  . Years of Education: N/A   Occupational History  . Not on file.   Social History Main Topics  . Smoking status: Former Smoker -- 3 years    Types: Cigarettes    Quit date: 02/17/1996  . Smokeless tobacco: Never Used  . Alcohol Use: No  . Drug Use: No  . Sexually Active: Not on file   Other Topics Concern  . Not on file   Social History Narrative  . No narrative on file     BP 130/86  Pulse 88  Ht 5\' 6"  (1.676 m)  Wt 163 lb 12.8 oz (74.299 kg)  BMI 26.45 kg/m2  Physical Exam:  Well appearing 79 year old woman,NAD HEENT: Unremarkable Neck:  7 cm JVD, no thyromegally Back:  No  CVA tenderness Lungs:  Clear with no wheezes, rales, or rhonchi. HEART:  Regular rate rhythm, no murmurs, no rubs, no clicks Abd:  soft, positive bowel sounds, no organomegally, no rebound, no guarding Ext:  2 plus pulses, no edema, no cyanosis, no clubbing Skin:  No rashes no nodules Neuro:  CN II through XII intact, motor grossly intact  DEVICE  Normal device function.  See PaceArt for details.   Assess/Plan:

## 2013-03-26 NOTE — Patient Instructions (Addendum)
Your physician wants you to follow-up in: 12 months with Dr Taylor You will receive a reminder letter in the mail two months in advance. If you don't receive a letter, please call our office to schedule the follow-up appointment.   Remote monitoring is used to monitor your Pacemaker of ICD from home. This monitoring reduces the number of office visits required to check your device to one time per year. It allows us to keep an eye on the functioning of your device to ensure it is working properly. You are scheduled for a device check from home on 06/30/13. You may send your transmission at any time that day. If you have a wireless device, the transmission will be sent automatically. After your physician reviews your transmission, you will receive a postcard with your next transmission date.    

## 2013-04-07 ENCOUNTER — Other Ambulatory Visit: Payer: Self-pay | Admitting: Orthopaedic Surgery

## 2013-04-07 DIAGNOSIS — M545 Low back pain: Secondary | ICD-10-CM

## 2013-04-08 ENCOUNTER — Ambulatory Visit: Payer: Self-pay

## 2013-04-10 ENCOUNTER — Ambulatory Visit
Admission: RE | Admit: 2013-04-10 | Discharge: 2013-04-10 | Disposition: A | Discharge status: Home / Self Care | Payer: Medicare Other | Source: Ambulatory Visit | Attending: Orthopaedic Surgery | Admitting: Orthopaedic Surgery

## 2013-04-10 DIAGNOSIS — M545 Low back pain: Secondary | ICD-10-CM

## 2013-04-17 ENCOUNTER — Other Ambulatory Visit: Payer: Self-pay | Admitting: *Deleted

## 2013-04-17 DIAGNOSIS — M79609 Pain in unspecified limb: Secondary | ICD-10-CM

## 2013-04-21 ENCOUNTER — Other Ambulatory Visit: Payer: Self-pay | Admitting: Family Medicine

## 2013-04-22 ENCOUNTER — Ambulatory Visit (INDEPENDENT_AMBULATORY_CARE_PROVIDER_SITE_OTHER): Payer: Medicare Other

## 2013-04-22 ENCOUNTER — Ambulatory Visit (INDEPENDENT_AMBULATORY_CARE_PROVIDER_SITE_OTHER): Payer: Medicare Other | Admitting: Family Medicine

## 2013-04-22 VITALS — BP 138/88 | HR 99 | Temp 98.0°F | Ht 65.5 in | Wt 160.0 lb

## 2013-04-22 DIAGNOSIS — S66819A Strain of other specified muscles, fascia and tendons at wrist and hand level, unspecified hand, initial encounter: Secondary | ICD-10-CM

## 2013-04-22 DIAGNOSIS — I4891 Unspecified atrial fibrillation: Secondary | ICD-10-CM

## 2013-04-22 DIAGNOSIS — IMO0002 Reserved for concepts with insufficient information to code with codable children: Secondary | ICD-10-CM

## 2013-04-22 DIAGNOSIS — T1490XA Injury, unspecified, initial encounter: Secondary | ICD-10-CM

## 2013-04-22 DIAGNOSIS — S63502A Unspecified sprain of left wrist, initial encounter: Secondary | ICD-10-CM

## 2013-04-22 LAB — POCT INR: INR: 1.9

## 2013-04-22 MED ORDER — ALPRAZOLAM 0.25 MG PO TABS: 0.2500 mg | ORAL_TABLET | Freq: Every evening | ORAL | Status: DC | PRN

## 2013-04-23 ENCOUNTER — Telehealth: Payer: Self-pay | Admitting: Family Medicine

## 2013-04-24 ENCOUNTER — Telehealth: Payer: Self-pay | Admitting: Family Medicine

## 2013-04-24 NOTE — Telephone Encounter (Signed)
Pt aware of xr results

## 2013-04-24 NOTE — Telephone Encounter (Signed)
No fracture. Just significant arthritis.

## 2013-05-05 ENCOUNTER — Other Ambulatory Visit: Payer: Self-pay | Admitting: Family Medicine

## 2013-05-09 ENCOUNTER — Encounter (HOSPITAL_COMMUNITY): Payer: Self-pay | Admitting: Emergency Medicine

## 2013-05-09 ENCOUNTER — Emergency Department (HOSPITAL_COMMUNITY)
Admission: EM | Admit: 2013-05-09 | Discharge: 2013-05-09 | Disposition: A | Discharge status: Home / Self Care | Payer: Medicare Other | Attending: Emergency Medicine | Admitting: Emergency Medicine

## 2013-05-09 ENCOUNTER — Emergency Department (HOSPITAL_COMMUNITY): Payer: Medicare Other

## 2013-05-09 DIAGNOSIS — R002 Palpitations: Secondary | ICD-10-CM | POA: Insufficient documentation

## 2013-05-09 DIAGNOSIS — I4892 Unspecified atrial flutter: Secondary | ICD-10-CM | POA: Diagnosis present

## 2013-05-09 DIAGNOSIS — I4891 Unspecified atrial fibrillation: Secondary | ICD-10-CM

## 2013-05-09 DIAGNOSIS — Z8719 Personal history of other diseases of the digestive system: Secondary | ICD-10-CM | POA: Insufficient documentation

## 2013-05-09 DIAGNOSIS — Z86718 Personal history of other venous thrombosis and embolism: Secondary | ICD-10-CM | POA: Insufficient documentation

## 2013-05-09 DIAGNOSIS — I495 Sick sinus syndrome: Secondary | ICD-10-CM | POA: Insufficient documentation

## 2013-05-09 DIAGNOSIS — Z8739 Personal history of other diseases of the musculoskeletal system and connective tissue: Secondary | ICD-10-CM | POA: Insufficient documentation

## 2013-05-09 DIAGNOSIS — I10 Essential (primary) hypertension: Secondary | ICD-10-CM | POA: Insufficient documentation

## 2013-05-09 DIAGNOSIS — I252 Old myocardial infarction: Secondary | ICD-10-CM | POA: Insufficient documentation

## 2013-05-09 DIAGNOSIS — Z87891 Personal history of nicotine dependence: Secondary | ICD-10-CM | POA: Insufficient documentation

## 2013-05-09 DIAGNOSIS — Z79899 Other long term (current) drug therapy: Secondary | ICD-10-CM | POA: Insufficient documentation

## 2013-05-09 DIAGNOSIS — R61 Generalized hyperhidrosis: Secondary | ICD-10-CM | POA: Insufficient documentation

## 2013-05-09 DIAGNOSIS — R3915 Urgency of urination: Secondary | ICD-10-CM | POA: Insufficient documentation

## 2013-05-09 DIAGNOSIS — R0609 Other forms of dyspnea: Secondary | ICD-10-CM | POA: Insufficient documentation

## 2013-05-09 DIAGNOSIS — E785 Hyperlipidemia, unspecified: Secondary | ICD-10-CM | POA: Insufficient documentation

## 2013-05-09 DIAGNOSIS — G8929 Other chronic pain: Secondary | ICD-10-CM | POA: Insufficient documentation

## 2013-05-09 DIAGNOSIS — Z7901 Long term (current) use of anticoagulants: Secondary | ICD-10-CM

## 2013-05-09 DIAGNOSIS — R350 Frequency of micturition: Secondary | ICD-10-CM | POA: Insufficient documentation

## 2013-05-09 DIAGNOSIS — R0989 Other specified symptoms and signs involving the circulatory and respiratory systems: Secondary | ICD-10-CM | POA: Insufficient documentation

## 2013-05-09 DIAGNOSIS — R000 Tachycardia, unspecified: Secondary | ICD-10-CM | POA: Insufficient documentation

## 2013-05-09 DIAGNOSIS — Z96649 Presence of unspecified artificial hip joint: Secondary | ICD-10-CM | POA: Insufficient documentation

## 2013-05-09 DIAGNOSIS — Z950 Presence of cardiac pacemaker: Secondary | ICD-10-CM | POA: Insufficient documentation

## 2013-05-09 DIAGNOSIS — R0602 Shortness of breath: Secondary | ICD-10-CM | POA: Insufficient documentation

## 2013-05-09 DIAGNOSIS — Z9889 Other specified postprocedural states: Secondary | ICD-10-CM | POA: Insufficient documentation

## 2013-05-09 LAB — BASIC METABOLIC PANEL
BUN: 24 mg/dL — ABNORMAL HIGH (ref 6–23)
Calcium: 9.1 mg/dL (ref 8.4–10.5)
Creatinine, Ser: 1 mg/dL (ref 0.50–1.10)
GFR calc non Af Amer: 52 mL/min — ABNORMAL LOW (ref >90)
Glucose, Bld: 129 mg/dL — ABNORMAL HIGH (ref 70–99)
Sodium: 139 mEq/L (ref 135–145)

## 2013-05-09 LAB — POCT I-STAT TROPONIN I

## 2013-05-09 LAB — URINALYSIS, ROUTINE W REFLEX MICROSCOPIC
Glucose, UA: NEGATIVE mg/dL
Hgb urine dipstick: NEGATIVE
Specific Gravity, Urine: 1.034 — ABNORMAL HIGH (ref 1.005–1.030)
pH: 5.5 (ref 5.0–8.0)

## 2013-05-09 LAB — CBC
HCT: 42.2 % (ref 36.0–46.0)
Hemoglobin: 14.8 g/dL (ref 12.0–15.0)
MCH: 31.7 pg (ref 26.0–34.0)
MCHC: 35.1 g/dL (ref 30.0–36.0)

## 2013-05-09 MED ORDER — METOPROLOL TARTRATE 25 MG PO TABS: 25.0000 mg | ORAL_TABLET | Freq: Two times a day (BID) | ORAL | Status: DC

## 2013-05-09 MED ORDER — METOPROLOL TARTRATE 1 MG/ML IV SOLN
5.0000 mg | Freq: Once | INTRAVENOUS | Status: AC
  Administered 2013-05-09: 2.5 mg via INTRAVENOUS
  Filled 2013-05-09: qty 5

## 2013-05-09 NOTE — ED Provider Notes (Signed)
CSN: [id number]     Arrival date & time 05/09/13  1301 History     First MD Initiated Contact with Patient 05/09/13 1319     Chief Complaint  Patient presents with  . Atrial Fibrillation   (Consider location/radiation/quality/duration/timing/severity/associated sxs/prior Treatment) HPI Comments: Patient presents to ER for evaluation of atrial fibrillation. Patient reports that she has a history of atrial fibrillation, takes metoprolol and Coumadin. She says for the last week she has not been feeling well and today her heart rate was up above 130. Patient is not experiencing any chest pain. She does report intermittent shortness of breath. Patient reports for the last week she's been feeling weaker than usual and experiencing intermittent episodes of diaphoresis. She does not have associated chest pain.  Patient is a 79 y.o. female presenting with atrial fibrillation.  Atrial Fibrillation Associated symptoms include shortness of breath. Pertinent negatives include no chest pain.    Past Medical History  Diagnosis Date  . Atrial flutter   . Tachycardia-bradycardia syndrome   . HTN (hypertension)   . Chronic anticoagulation   . Small bowel problem 12/2007  . AMI (acute myocardial infarction)   . Pacemaker   . DVT (deep venous thrombosis)   . GI bleeding   . Hyperlipidemia   . DDD (degenerative disc disease), lumbar   . DJD (degenerative joint disease) of knee     bilateral    . DJD (degenerative joint disease)     hips   . Chronic pain   . Back pain   . Hypertension    Past Surgical History  Procedure Laterality Date  . Small bowel capsule endoscopy.  12/10/2007    . Esophagogastroduodenoscopy.  11/06/2007    . Hip arthroplasty-total.. 09/04/2006     Family History  Problem Relation Age of Onset  . Heart disease Mother   . Stroke Mother   . Hypertension Father   . Cancer Sister   . Cancer Brother    History  Substance Use Topics  . Smoking status: Former Smoker -- 3  years    Types: Cigarettes    Quit date: 02/17/1996  . Smokeless tobacco: Never Used  . Alcohol Use: No   OB History   Grav Para Term Preterm Abortions TAB SAB Ect Mult Living                 Review of Systems  Constitutional: Positive for diaphoresis. Negative for fever.  Respiratory: Positive for shortness of breath.   Cardiovascular: Positive for palpitations. Negative for chest pain.  Genitourinary: Positive for urgency and frequency.  All other systems reviewed and are negative.    Allergies  Codeine; Lopid; Naprosyn; and Percocet  Home Medications   Current Outpatient Rx  Name  Route  Sig  Dispense  Refill  . ALPRAZolam (XANAX) 0.25 MG tablet   Oral   Take 1 tablet (0.25 mg total) by mouth at bedtime as needed for sleep.   10 tablet   0   . Calcium Carb-Cholecalciferol (CALCIUM 1000 + D PO)   Oral   Take 1 tablet by mouth daily.         . Cholecalciferol (VITAMIN D) 1000 UNITS capsule   Oral   Take 2,000 Units by mouth daily.          . diclofenac sodium (VOLTAREN) 1 % GEL   Topical   Apply topically 4 (four) times daily.         Marland Kitchen esomeprazole (NEXIUM) 40 MG capsule  Oral   Take 40 mg by mouth every other day.          . ezetimibe (ZETIA) 10 MG tablet   Oral   Take 10 mg by mouth daily.           . furosemide (LASIX) 20 MG tablet   Oral   Take 20 mg by mouth daily as needed. swelling         . lisinopril (PRINIVIL,ZESTRIL) 20 MG tablet      TAKE 1 TABLET DAILY   30 tablet   2   . metoprolol tartrate (LOPRESSOR) 25 MG tablet      TAKE 1/2 TABLET TWICE A DAY   60 tablet   5   . Multiple Vitamin (MULTIVITAMIN) capsule   Oral   Take 1 capsule by mouth daily.          . Omega-3 Fatty Acids (FISH OIL) 1000 MG CAPS   Oral   Take 2,000-3,000 mg by mouth 2 (two) times daily. 2000 mg in the morning & 3000 mg in the evening         . pravastatin (PRAVACHOL) 40 MG tablet   Oral   Take 40 mg by mouth daily.         .  traMADol (ULTRAM) 50 MG tablet   Oral   Take 1 tablet (50 mg total) by mouth every 6 (six) hours as needed. For pain   120 tablet   0   . verapamil (CALAN-SR) 240 MG CR tablet      TAKE 1 TABLET EVERY MORNING & 1/2 TABLET EVERY EVENING   45 tablet   5   . vitamin C (ASCORBIC ACID) 500 MG tablet   Oral   Take 500 mg by mouth daily.         Marland Kitchen warfarin (COUMADIN) 3 MG tablet   Oral   Take 3 mg by mouth. Takes 3 mg on Mondays & 2 mg all other days of the week          BP 119/88  Pulse 137  Temp(Src) 98.1 F (36.7 C) (Oral)  Resp 18  SpO2 97% Physical Exam  Constitutional: She is oriented to person, place, and time. She appears well-developed and well-nourished. No distress.  HENT:  Head: Normocephalic and atraumatic.  Right Ear: Hearing normal.  Left Ear: Hearing normal.  Nose: Nose normal.  Mouth/Throat: Oropharynx is clear and moist and mucous membranes are normal.  Eyes: Conjunctivae and EOM are normal. Pupils are equal, round, and reactive to light.  Neck: Normal range of motion. Neck supple.  Cardiovascular: S1 normal and S2 normal.  An irregularly irregular rhythm present. Tachycardia present.  Exam reveals no gallop and no friction rub.   No murmur heard. Pulmonary/Chest: Effort normal and breath sounds normal. No respiratory distress. She exhibits no tenderness.  Abdominal: Soft. Normal appearance and bowel sounds are normal. There is no hepatosplenomegaly. There is no tenderness. There is no rebound, no guarding, no tenderness at McBurney's point and negative Murphy's sign. No hernia.  Musculoskeletal: Normal range of motion.  Neurological: She is alert and oriented to person, place, and time. She has normal strength. No cranial nerve deficit or sensory deficit. Coordination normal. GCS eye subscore is 4. GCS verbal subscore is 5. GCS motor subscore is 6.  Skin: Skin is warm, dry and intact. No rash noted. No cyanosis.  Psychiatric: She has a normal mood and  affect. Her speech is normal and behavior is normal.  Thought content normal.    ED Course   Procedures (including critical care time)  EKG:  Date: 05/09/2013  Rate: 116  Rhythm: atrial flutter  QRS Axis: normal  Intervals: normal  ST/T Wave abnormalities: nonspecific ST changes  Conduction Disutrbances:none  Narrative Interpretation:   Old EKG Reviewed: previous EKG shows AV pacing  EKG#2:  Date: 05/09/2013  Rate: 74  Rhythm: V pacing  QRS Axis: normal     Labs Reviewed  BASIC METABOLIC PANEL - Abnormal; Notable for the following:    Glucose, Bld 129 (*)    BUN 24 (*)    GFR calc non Af Amer 52 (*)    GFR calc Af Amer 60 (*)    All other components within normal limits  PROTIME-INR - Abnormal; Notable for the following:    Prothrombin Time 24.0 (*)    INR 2.23 (*)    All other components within normal limits  PRO B NATRIURETIC PEPTIDE - Abnormal; Notable for the following:    Pro B Natriuretic peptide (BNP) 1249.0 (*)    All other components within normal limits  CBC  URINALYSIS, ROUTINE W REFLEX MICROSCOPIC  POCT I-STAT TROPONIN I   Dg Chest 2 View  05/09/2013   *RADIOLOGY REPORT*  Clinical Data: Atrial fibrillation.  Dizziness.  CHEST - 2 VIEW  Comparison: Chest radiograph 06/17/2012.  Findings: Borderline heart size for projection.  Tortuous thoracic aorta with atherosclerosis.  Unchanged dual lead left subclavian cardiac pacemaker.  The lungs appear clear.  There is no airspace disease or pleural effusion.  Partially visualized lumbar fixation hardware.  IMPRESSION: No active cardiopulmonary disease.   Original Report Authenticated By: Andreas Newport, M.D.   Diagnosis: Atrial fibrillation with rapid ventricular response  MDM  Patient presents to the ER for evaluation of one week of generalized weakness, intermittent sweats and shortness of breath. Patient reports that she has noticed that her heart rate was elevated to 130 at home today. Patient has a history of  atrial fibrillation, on rate control and Coumadin. Patient was administered a small amount of IV Lopressor and weight has decreased. Repeat EKG now shows ventricular pacing at a rate of 70. Initial cardiac workup negative. [facility] has been consulted to evaluate the patient for disposition.    Gilda Crease, MD 05/09/13 1534

## 2013-05-09 NOTE — ED Notes (Signed)
Pt here with afib x several days; pt sts hx of same but tachycardic today; pt sts episodes of diaphoresis and SOB; pt c/o leg pain
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
## 2013-05-12 ENCOUNTER — Telehealth: Payer: Self-pay | Admitting: Internal Medicine

## 2013-05-12 NOTE — Telephone Encounter (Signed)
New prob  Pt states she is very shaky this morning and she feels that her heart rate and BP are up.

## 2013-05-12 NOTE — Telephone Encounter (Addendum)
Had an episode this morning where she felt like her heart was racing again but after she got up and moved around she feels better.  She has increased her Metoprolol to 25mg  bid but did not take an extra dose today.  Even though her pressure was up she still did not take.  She has a follow up tomorrow and can address more issues then

## 2013-05-13 ENCOUNTER — Ambulatory Visit (INDEPENDENT_AMBULATORY_CARE_PROVIDER_SITE_OTHER): Payer: Medicare Other | Admitting: Nurse Practitioner

## 2013-05-13 ENCOUNTER — Encounter: Payer: Self-pay | Admitting: Nurse Practitioner

## 2013-05-13 VITALS — BP 128/90 | HR 73 | Ht 65.5 in | Wt 159.0 lb

## 2013-05-13 DIAGNOSIS — I4891 Unspecified atrial fibrillation: Secondary | ICD-10-CM

## 2013-05-13 LAB — BASIC METABOLIC PANEL
BUN: 20 mg/dL (ref 6–23)
CO2: 29 mEq/L (ref 19–32)
Calcium: 9.6 mg/dL (ref 8.4–10.5)
Chloride: 101 mEq/L (ref 96–112)
Creatinine, Ser: 1.1 mg/dL (ref 0.4–1.2)
GFR: 53.05 mL/min — ABNORMAL LOW (ref >60.00)
Glucose, Bld: 85 mg/dL (ref 70–99)
Potassium: 4.2 mEq/L (ref 3.5–5.1)
Sodium: 138 mEq/L (ref 135–145)

## 2013-05-13 LAB — PROTIME-INR
INR: 2.8 ratio — ABNORMAL HIGH (ref 0.8–1.0)
Prothrombin Time: 29.5 s — ABNORMAL HIGH (ref 10.2–12.4)

## 2013-05-13 MED ORDER — METOPROLOL TARTRATE 25 MG PO TABS: 25.0000 mg | ORAL_TABLET | Freq: Three times a day (TID) | ORAL | Status: DC
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
## 2013-05-13 NOTE — Patient Instructions (Addendum)
Increase your Lopressor to 25 mg three times a day - I have sent this to the drug store  We need to get an ultrasound of your heart  Stay on your other medicines  We will check labs today  See Dr. [name] in follow up  Call the [facility] office at [phone] if you have any questions, problems or concerns.

## 2013-05-14 ENCOUNTER — Ambulatory Visit (HOSPITAL_COMMUNITY): Payer: Medicare Other | Attending: Nurse Practitioner | Admitting: Radiology

## 2013-05-14 DIAGNOSIS — I10 Essential (primary) hypertension: Secondary | ICD-10-CM | POA: Insufficient documentation

## 2013-05-14 DIAGNOSIS — I2510 Atherosclerotic heart disease of native coronary artery without angina pectoris: Secondary | ICD-10-CM | POA: Insufficient documentation

## 2013-05-14 DIAGNOSIS — I4891 Unspecified atrial fibrillation: Secondary | ICD-10-CM | POA: Insufficient documentation

## 2013-05-14 DIAGNOSIS — E785 Hyperlipidemia, unspecified: Secondary | ICD-10-CM | POA: Insufficient documentation

## 2013-05-14 NOTE — Progress Notes (Signed)
Echocardiogram performed.  

## 2013-05-19 ENCOUNTER — Encounter: Payer: Self-pay | Admitting: Vascular Surgery

## 2013-05-20 ENCOUNTER — Encounter: Payer: Self-pay | Admitting: Vascular Surgery

## 2013-05-20 ENCOUNTER — Encounter (INDEPENDENT_AMBULATORY_CARE_PROVIDER_SITE_OTHER): Payer: Medicare Other | Admitting: *Deleted

## 2013-05-20 ENCOUNTER — Ambulatory Visit (INDEPENDENT_AMBULATORY_CARE_PROVIDER_SITE_OTHER): Payer: Medicare Other | Admitting: Vascular Surgery

## 2013-05-20 VITALS — BP 142/85 | HR 76 | Ht 65.5 in | Wt 160.7 lb

## 2013-05-20 DIAGNOSIS — M79609 Pain in unspecified limb: Secondary | ICD-10-CM | POA: Insufficient documentation

## 2013-05-20 DIAGNOSIS — I739 Peripheral vascular disease, unspecified: Secondary | ICD-10-CM | POA: Insufficient documentation

## 2013-05-20 NOTE — Progress Notes (Signed)
Subjective:     Patient ID: [name], female   DOB: [birth year], 79 y.o.   MRN: [id number]  HPI this 79 year old female is referred by Dr. Norlene Campbell for evaluation of right greater than left leg discomfort. I evaluated her one year ago for venous insufficiency. She was found to have some diffuse spider and reticular veins but no serious venous abnormalities. She has a history of lumbar spine disease with 2 previous operations and also has fallen in the past several months. She complains of discomfort in the legs and feet which is frequently present when she awakens in the morning and when she goes to bed at night. Does improve elevation. She is limited in ambulation because of her back. Has a history of DVT.  Past Medical History  Diagnosis Date  . Atrial flutter   . Tachycardia-bradycardia syndrome   . HTN (hypertension)   . Chronic anticoagulation   . Small bowel problem 12/2007  . AMI (acute myocardial infarction)   . Pacemaker   . DVT (deep venous thrombosis)   . GI bleeding   . Hyperlipidemia   . DDD (degenerative disc disease), lumbar   . DJD (degenerative joint disease) of knee     bilateral    . DJD (degenerative joint disease)     hips   . Chronic pain   . Back pain   . Hypertension   . Anemia   . COPD (chronic obstructive pulmonary disease)   . Peripheral vascular disease     History  Substance Use Topics  . Smoking status: Former Smoker -- 3 years    Types: Cigarettes    Quit date: 02/17/1996  . Smokeless tobacco: Never Used  . Alcohol Use: No    Family History  Problem Relation Age of Onset  . Heart disease Mother   . Stroke Mother   . Hypertension Father   . Cancer Sister   . Cancer Brother     Allergies  Allergen Reactions  . Codeine   . Lopid [Gemfibrozil]   . Naprosyn [Naproxen]     Upset stomach    . Percocet [Oxycodone-Acetaminophen]     Nausea      Current outpatient prescriptions:acetaminophen (TYLENOL) 500 MG tablet, Take  1,000 mg by mouth 2 (two) times daily as needed for pain., Disp: , Rfl: ;  Calcium 500 MG tablet, Take 1,000 mg by mouth daily., Disp: , Rfl: ;  Cholecalciferol (VITAMIN D) 1000 UNITS capsule, Take 2,000 Units by mouth daily. , Disp: , Rfl: ;  esomeprazole (NEXIUM) 40 MG capsule, Take 40 mg by mouth daily as needed (for indigestion). , Disp: , Rfl:  ezetimibe (ZETIA) 10 MG tablet, Take 10 mg by mouth daily.  , Disp: , Rfl: ;  furosemide (LASIX) 20 MG tablet, Take 20 mg by mouth daily as needed. swelling, Disp: , Rfl: ;  lisinopril (PRINIVIL,ZESTRIL) 20 MG tablet, Take 20 mg by mouth daily., Disp: , Rfl: ;  metoprolol tartrate (LOPRESSOR) 25 MG tablet, Take 1 tablet (25 mg total) by mouth 3 (three) times daily., Disp: 90 tablet, Rfl: 6 Multiple Vitamin (MULTIVITAMIN WITH MINERALS) TABS tablet, Take 1 tablet by mouth daily., Disp: , Rfl: ;  Omega-3 Fatty Acids (FISH OIL) 1000 MG CAPS, Take 1,000 mg by mouth 3 (three) times daily. 2000 mg in the morning & 3000 mg in the evening, Disp: , Rfl: ;  pravastatin (PRAVACHOL) 40 MG tablet, Take 40 mg by mouth daily., Disp: , Rfl: ;  traMADol (ULTRAM)  50 MG tablet, Take 50 mg by mouth 2 (two) times daily as needed for pain., Disp: , Rfl:  verapamil (CALAN-SR) 240 MG CR tablet, Take 120-240 mg by mouth 2 (two) times daily. Take 1 tablet in the morning and 1/2 tablet at night, Disp: , Rfl: ;  vitamin C (ASCORBIC ACID) 500 MG tablet, Take 500 mg by mouth daily., Disp: , Rfl: ;  warfarin (COUMADIN) 3 MG tablet, Take 1.5-3 mg by mouth daily. Takes 3 mg three days a week and 1.5 mg four days a week, Disp: , Rfl:   BP 142/85  Pulse 76  Ht 5' 5.5" (1.664 m)  Wt 160 lb 11.2 oz (72.893 kg)  BMI 26.33 kg/m2  SpO2 97%  Body mass index is 26.33 kg/(m^2).           Review of Systems denies chest pain or dyspnea on exertion. Does complain of leg pain with walking, swelling in legs, denies hemoptysis. Other systems negative and complete review of systems. She was  seen  in the emergency department for an arrhythmia recently. Does have a pacemaker and has remote history of myocardial infarction 2004    Objective:   Physical Exam BP 142/85  Pulse 76  Ht 5' 5.5" (1.664 m)  Wt 160 lb 11.2 oz (72.893 kg)  BMI 26.33 kg/m2  SpO2 97%  Gen.-alert and oriented x3 in no apparent distress HEENT normal for age Lungs no rhonchi or wheezing--pacemaker in the left infraclavicular area Cardiovascular regular rhythm no murmurs carotid pulses 3+ palpable no bruits audible Abdomen soft nontender no palpable masses Musculoskeletal free of  major deformities Skin clear -no rashes Neurologic normal Lower extremities 3+ femoral and dorsalis pedis pulses palpable bilaterally with no edema Diffuse spider and reticular veins medial thigh bilaterally particularly distally. No hyperpigmentation or ulceration noted or bulging varicosities.  Patient had a normal venous duplex exam one year ago other than a few small areas of reflux. Today I ordered an arterial Doppler which are reviewed and interpreted. She has normal ABIs bilaterally with triphasic flow -normal study        Assessment:     Bilateral back and leg discomfort-possibly do to lumbar spine disease a recent fall No evidence of significant arterial insufficiency and venous insufficiency is confined to spider veins in both thighs which would not cause this discomfort    Plan:     Patient return to Dr. Cleophas Dunker for further evaluation and treatment-no need for any further arterial or venous evaluation

## 2013-05-22 ENCOUNTER — Encounter: Payer: Self-pay | Admitting: *Deleted

## 2013-06-11 ENCOUNTER — Ambulatory Visit: Payer: Medicare Other | Admitting: Family Medicine

## 2013-06-13 ENCOUNTER — Encounter: Payer: Self-pay | Admitting: Family Medicine

## 2013-06-13 ENCOUNTER — Ambulatory Visit (INDEPENDENT_AMBULATORY_CARE_PROVIDER_SITE_OTHER): Payer: Medicare Other | Admitting: Family Medicine

## 2013-06-13 VITALS — BP 135/95 | HR 104 | Temp 97.8°F | Wt 161.4 lb

## 2013-06-13 DIAGNOSIS — M79609 Pain in unspecified limb: Secondary | ICD-10-CM

## 2013-06-13 DIAGNOSIS — Z7901 Long term (current) use of anticoagulants: Secondary | ICD-10-CM

## 2013-06-13 DIAGNOSIS — E785 Hyperlipidemia, unspecified: Secondary | ICD-10-CM

## 2013-06-13 DIAGNOSIS — I10 Essential (primary) hypertension: Secondary | ICD-10-CM

## 2013-06-13 DIAGNOSIS — Z950 Presence of cardiac pacemaker: Secondary | ICD-10-CM

## 2013-06-13 DIAGNOSIS — M549 Dorsalgia, unspecified: Secondary | ICD-10-CM

## 2013-06-13 DIAGNOSIS — J329 Chronic sinusitis, unspecified: Secondary | ICD-10-CM | POA: Insufficient documentation

## 2013-06-13 DIAGNOSIS — I4892 Unspecified atrial flutter: Secondary | ICD-10-CM

## 2013-06-13 LAB — POCT INR: INR: 1.8

## 2013-06-13 MED ORDER — TRAMADOL HCL 50 MG PO TABS: 50.0000 mg | ORAL_TABLET | Freq: Three times a day (TID) | ORAL | Status: DC | PRN

## 2013-06-13 MED ORDER — FLUTICASONE PROPIONATE 50 MCG/ACT NA SUSP: 2.0000 | Freq: Every day | NASAL | Status: DC

## 2013-06-13 MED ORDER — AMOXICILLIN 875 MG PO TABS: 875.0000 mg | ORAL_TABLET | Freq: Two times a day (BID) | ORAL | Status: DC
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
## 2013-06-15 LAB — NMR, LIPOPROFILE
Cholesterol: 299 mg/dL — ABNORMAL HIGH (ref <200)
HDL Cholesterol by NMR: 42 mg/dL (ref >=40)
HDL Particle Number: 33.3 umol/L (ref >=30.5)
LDL Particle Number: 2330 nmol/L — ABNORMAL HIGH (ref <1000)
LDL Size: 20.2 nm — ABNORMAL LOW (ref >20.5)
LP-IR Score: 62 — ABNORMAL HIGH (ref <=45)
Small LDL Particle Number: 1514 nmol/L — ABNORMAL HIGH (ref <=527)
Triglycerides by NMR: 462 mg/dL — ABNORMAL HIGH (ref <150)

## 2013-06-15 LAB — CMP14+EGFR
ALT: 16 IU/L (ref 0–32)
AST: 23 IU/L (ref 0–40)
Albumin/Globulin Ratio: 1.7 (ref 1.1–2.5)
Albumin: 4.3 g/dL (ref 3.5–4.8)
Alkaline Phosphatase: 83 IU/L (ref 39–117)
BUN/Creatinine Ratio: 18 (ref 11–26)
BUN: 17 mg/dL (ref 8–27)
CO2: 26 mmol/L (ref 18–29)
Calcium: 10.2 mg/dL (ref 8.6–10.2)
Chloride: 98 mmol/L (ref 97–108)
Creatinine, Ser: 0.92 mg/dL (ref 0.57–1.00)
GFR calc Af Amer: 68 mL/min/{1.73_m2} (ref >59)
GFR calc non Af Amer: 59 mL/min/{1.73_m2} — ABNORMAL LOW (ref >59)
Globulin, Total: 2.5 g/dL (ref 1.5–4.5)
Glucose: 92 mg/dL (ref 65–99)
Potassium: 5.1 mmol/L (ref 3.5–5.2)
Sodium: 139 mmol/L (ref 134–144)
Total Bilirubin: 0.5 mg/dL (ref 0.0–1.2)
Total Protein: 6.8 g/dL (ref 6.0–8.5)

## 2013-06-17 ENCOUNTER — Telehealth: Payer: Self-pay | Admitting: Family Medicine

## 2013-06-17 NOTE — Telephone Encounter (Signed)
Pt notiifed of labs

## 2013-06-30 ENCOUNTER — Ambulatory Visit (INDEPENDENT_AMBULATORY_CARE_PROVIDER_SITE_OTHER): Payer: Medicare Other | Admitting: *Deleted

## 2013-06-30 ENCOUNTER — Encounter: Payer: Self-pay | Admitting: Internal Medicine

## 2013-06-30 DIAGNOSIS — I495 Sick sinus syndrome: Secondary | ICD-10-CM

## 2013-06-30 DIAGNOSIS — I4891 Unspecified atrial fibrillation: Secondary | ICD-10-CM

## 2013-07-01 LAB — REMOTE PACEMAKER DEVICE
ATRIAL PACING PM: 78
BAMS-0001: 150 {beats}/min
RV LEAD IMPEDENCE PM: 483 Ohm
VENTRICULAR PACING PM: 30

## 2013-07-01 NOTE — Progress Notes (Signed)
  Subjective:    Patient ID: [name], female    DOB: [birth year], 79 y.o.   MRN: [id number]  HPI  L forearm and wrist pain  Pt accidentally fell on steps.  Has had lateral forearm and wrist pain since this point No numbness or paresthesias.  Grip strength intact.  Baseline hx/o afib, on coumadin.  No head trauma associated with fall.  No bleeding per pt.    Review of Systems  All other systems reviewed and are negative.       Objective:   Physical Exam  Constitutional: She appears well-developed and well-nourished.  HENT:  Head: Normocephalic and atraumatic.  Eyes: Conjunctivae are normal. Pupils are equal, round, and reactive to light.  Neck: Normal range of motion. Neck supple.  Cardiovascular: Normal rate and regular rhythm.   Pulmonary/Chest: Effort normal.  Abdominal: Soft.  Musculoskeletal:       Arms: + TTP over affected area  + pain with resisted wrist flexion and extension.  Grip strength intact albeit mildly decreased given age.    Neurological: She is alert.  Skin: Skin is warm.   [facility] reading (PRIMARY) by  Dr. Alvester Morin  L wrist and forearm xrays preliminarily negative for any fracture or dislocation.  Noted degenerative changes in wrist.                                          Assessment & Plan:  A-fib - Plan: POCT INR  Injury - Plan: DG Wrist Complete Left, DG Forearm Left  INR WNL at 1.9.  Xrays preliminarily without any fracture or dislocation.  Will place in brace.  Tylenol/tramadol (avoid NSAIDs given coumadin use) Discussed MSK red flags.  Follow up as needed.     The patient and/or caregiver has been counseled thoroughly with regard to treatment plan and/or medications prescribed including dosage, schedule, interactions, rationale for use, and possible side effects and they verbalize understanding. Diagnoses and expected course of recovery discussed and will return if not improved as expected or if the condition worsens. Patient  and/or caregiver verbalized understanding.

## 2013-07-08 ENCOUNTER — Ambulatory Visit (INDEPENDENT_AMBULATORY_CARE_PROVIDER_SITE_OTHER): Payer: Medicare Other | Admitting: Pharmacist Clinician (PhC)/ Clinical Pharmacy Specialist

## 2013-07-08 DIAGNOSIS — I4891 Unspecified atrial fibrillation: Secondary | ICD-10-CM

## 2013-07-08 DIAGNOSIS — Z23 Encounter for immunization: Secondary | ICD-10-CM

## 2013-07-08 DIAGNOSIS — E785 Hyperlipidemia, unspecified: Secondary | ICD-10-CM

## 2013-07-08 LAB — POCT INR: INR: 2

## 2013-07-09 ENCOUNTER — Other Ambulatory Visit (INDEPENDENT_AMBULATORY_CARE_PROVIDER_SITE_OTHER): Payer: Medicare Other

## 2013-07-09 DIAGNOSIS — E785 Hyperlipidemia, unspecified: Secondary | ICD-10-CM

## 2013-07-10 LAB — LIPID PANEL
Cholesterol, Total: 226 mg/dL — ABNORMAL HIGH (ref 100–199)
HDL: 41 mg/dL (ref >39)
Triglycerides: 204 mg/dL — ABNORMAL HIGH (ref 0–149)

## 2013-07-11 ENCOUNTER — Other Ambulatory Visit: Payer: Self-pay | Admitting: Family Medicine

## 2013-07-14 NOTE — Telephone Encounter (Signed)
LAST SEEN AND FILLED 06/13/13.Route to pool A so it can be called into [facility] if approved

## 2013-07-14 NOTE — Progress Notes (Signed)
  Subjective:    Patient ID: [name], female    DOB: Jan 14, 1931, 79 y.o.   MRN: [id number]  HPI    Review of Systems     Objective:   Physical Exam        Assessment & Plan:   Lipid Clinic Consultation   Chief Complaint:  No chief complaint on file.    Exam Regularity:  RRR Edema:  neg Respirations:  18    Carotid Bruits:  neg Xanthomas:  neg General Appearance:  alert, oriented, no acute distress Mood/Affect:  normal  HPI:  Dyslipidemia     Component Value Date/Time   CHOL 299* 06/13/2013 1504   TRIG 204* 07/09/2013 0959   TRIG 226* 03/11/2013 1226   HDL 41 07/09/2013 0959   CHOLHDL 5.5* 07/09/2013 0959    Assessment: CHD/CHF Risk Equivalents:  CAD [place] Estd 75yrs risk:    NCEP Risk Factors Present:  HTN and age Primary Problem(s):  TG elevated  Current NCEP Goals: LDL Goal < 100 HDL Goal >/= 40 Tg Goal < 409 Non-HDL Goal < 130  Secondary cause of hyperlipidemia present:  none Low fat diet followed?  Yes -   Low carb diet followed?  Yes -   Exercise?  No - poor balance  Recommendations: Changes in lipid medication(s):  none Recheck Lipid Panel:  12 weeks Other labs needed:  TSH  Time spent counseling patient:  30 minutes  Physician time spent with patient:    Referring Provider:  Christell Constant   PharmD:  Chari Manning, [facility]

## 2013-07-15 ENCOUNTER — Encounter: Payer: Self-pay | Admitting: *Deleted

## 2013-07-15 ENCOUNTER — Telehealth: Payer: Self-pay | Admitting: Family Medicine

## 2013-07-15 MED ORDER — TRAMADOL HCL 50 MG PO TABS: 50.0000 mg | ORAL_TABLET | Freq: Three times a day (TID) | ORAL | Status: DC | PRN

## 2013-07-15 NOTE — Telephone Encounter (Signed)
Rx ready for Phone in. 

## 2013-07-15 NOTE — Telephone Encounter (Signed)
tramdol rx was not called in but printed and pt aware to pick up

## 2013-07-15 NOTE — Addendum Note (Signed)
Addended by: Pura Spice on: 07/15/2013 10:36 AM   Modules accepted: Orders

## 2013-07-23 ENCOUNTER — Telehealth: Payer: Self-pay | Admitting: Family Medicine

## 2013-07-23 ENCOUNTER — Other Ambulatory Visit: Payer: Self-pay

## 2013-07-23 MED ORDER — WARFARIN SODIUM 3 MG PO TABS: 1.5000 mg | ORAL_TABLET | Freq: Every day | ORAL | Status: DC

## 2013-07-29 ENCOUNTER — Encounter: Payer: Self-pay | Admitting: Internal Medicine

## 2013-07-29 ENCOUNTER — Ambulatory Visit (INDEPENDENT_AMBULATORY_CARE_PROVIDER_SITE_OTHER): Payer: Medicare Other | Admitting: Internal Medicine

## 2013-07-29 VITALS — BP 139/83 | HR 80 | Ht 65.0 in | Wt 166.0 lb

## 2013-07-29 DIAGNOSIS — Z950 Presence of cardiac pacemaker: Secondary | ICD-10-CM

## 2013-07-29 MED ORDER — VERAPAMIL HCL ER 120 MG PO TBCR: 120.0000 mg | EXTENDED_RELEASE_TABLET | Freq: Two times a day (BID) | ORAL | Status: AC

## 2013-07-29 MED ORDER — METOPROLOL TARTRATE 25 MG PO TABS: 25.0000 mg | ORAL_TABLET | Freq: Two times a day (BID) | ORAL | Status: DC

## 2013-07-29 MED ORDER — AMIODARONE HCL 200 MG PO TABS: 200.0000 mg | ORAL_TABLET | Freq: Two times a day (BID) | ORAL | Status: DC

## 2013-07-29 NOTE — Patient Instructions (Addendum)
Your physician recommends that you schedule a follow-up appointment in: 4-5 weeks with Dr [name]   Your physician has recommended you make the following change in your medication:  1) Decrease Metoprolol to 25mg  twice dialy 2) Decrease Verapamil to 120mg  twice daily 3) Start Amiodarone 200mg  twice daily   Have your INR checked next week in [place]

## 2013-07-29 NOTE — Assessment & Plan Note (Signed)
Her blood pressure is elevated. She will reduce her sodium intake. Additional treatment will depend on how her blood pressure is controlled when I see her back in several weeks.

## 2013-07-29 NOTE — Assessment & Plan Note (Signed)
I have discussed the treatment options with the patient. We discussed additional uptitration of her AV nodal blocking drugs. We also discussed inpatient Tikosyn vs initiation of Amiodarone. She does not want to go in the hospital for 3 days. We will start amiodarone, 200 mg twice daily.
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
## 2013-07-31 NOTE — Assessment & Plan Note (Signed)
Her device is working normally. Her atrial fib has increased. Will recheck device in several months.

## 2013-08-03 ENCOUNTER — Inpatient Hospital Stay (HOSPITAL_COMMUNITY)
Admission: EM | Admit: 2013-08-03 | Discharge: 2013-08-05 | DRG: 871 | Disposition: A | Discharge status: Home Health | Payer: Medicare Other | Attending: Family Medicine | Admitting: Family Medicine

## 2013-08-03 ENCOUNTER — Emergency Department (HOSPITAL_COMMUNITY): Payer: Medicare Other

## 2013-08-03 ENCOUNTER — Encounter (HOSPITAL_COMMUNITY): Payer: Self-pay | Admitting: Emergency Medicine

## 2013-08-03 DIAGNOSIS — Z96649 Presence of unspecified artificial hip joint: Secondary | ICD-10-CM

## 2013-08-03 DIAGNOSIS — I4892 Unspecified atrial flutter: Secondary | ICD-10-CM | POA: Diagnosis present

## 2013-08-03 DIAGNOSIS — I495 Sick sinus syndrome: Secondary | ICD-10-CM

## 2013-08-03 DIAGNOSIS — Z87891 Personal history of nicotine dependence: Secondary | ICD-10-CM

## 2013-08-03 DIAGNOSIS — I252 Old myocardial infarction: Secondary | ICD-10-CM

## 2013-08-03 DIAGNOSIS — M5137 Other intervertebral disc degeneration, lumbosacral region: Secondary | ICD-10-CM | POA: Diagnosis present

## 2013-08-03 DIAGNOSIS — E785 Hyperlipidemia, unspecified: Secondary | ICD-10-CM | POA: Diagnosis present

## 2013-08-03 DIAGNOSIS — A419 Sepsis, unspecified organism: Principal | ICD-10-CM

## 2013-08-03 DIAGNOSIS — J329 Chronic sinusitis, unspecified: Secondary | ICD-10-CM

## 2013-08-03 DIAGNOSIS — I872 Venous insufficiency (chronic) (peripheral): Secondary | ICD-10-CM

## 2013-08-03 DIAGNOSIS — I83893 Varicose veins of bilateral lower extremities with other complications: Secondary | ICD-10-CM

## 2013-08-03 DIAGNOSIS — N179 Acute kidney failure, unspecified: Secondary | ICD-10-CM | POA: Diagnosis present

## 2013-08-03 DIAGNOSIS — J9601 Acute respiratory failure with hypoxia: Secondary | ICD-10-CM

## 2013-08-03 DIAGNOSIS — J449 Chronic obstructive pulmonary disease, unspecified: Secondary | ICD-10-CM | POA: Diagnosis present

## 2013-08-03 DIAGNOSIS — M1710 Unilateral primary osteoarthritis, unspecified knee: Secondary | ICD-10-CM | POA: Diagnosis present

## 2013-08-03 DIAGNOSIS — I4891 Unspecified atrial fibrillation: Secondary | ICD-10-CM | POA: Diagnosis present

## 2013-08-03 DIAGNOSIS — M51379 Other intervertebral disc degeneration, lumbosacral region without mention of lumbar back pain or lower extremity pain: Secondary | ICD-10-CM | POA: Diagnosis present

## 2013-08-03 DIAGNOSIS — Z950 Presence of cardiac pacemaker: Secondary | ICD-10-CM

## 2013-08-03 DIAGNOSIS — R319 Hematuria, unspecified: Secondary | ICD-10-CM | POA: Diagnosis present

## 2013-08-03 DIAGNOSIS — Z86718 Personal history of other venous thrombosis and embolism: Secondary | ICD-10-CM

## 2013-08-03 DIAGNOSIS — I781 Nevus, non-neoplastic: Secondary | ICD-10-CM

## 2013-08-03 DIAGNOSIS — I739 Peripheral vascular disease, unspecified: Secondary | ICD-10-CM

## 2013-08-03 DIAGNOSIS — J9600 Acute respiratory failure, unspecified whether with hypoxia or hypercapnia: Secondary | ICD-10-CM | POA: Diagnosis present

## 2013-08-03 DIAGNOSIS — J189 Pneumonia, unspecified organism: Secondary | ICD-10-CM | POA: Diagnosis present

## 2013-08-03 DIAGNOSIS — Z823 Family history of stroke: Secondary | ICD-10-CM

## 2013-08-03 DIAGNOSIS — M79609 Pain in unspecified limb: Secondary | ICD-10-CM

## 2013-08-03 DIAGNOSIS — E860 Dehydration: Secondary | ICD-10-CM | POA: Diagnosis present

## 2013-08-03 DIAGNOSIS — J4489 Other specified chronic obstructive pulmonary disease: Secondary | ICD-10-CM | POA: Diagnosis present

## 2013-08-03 DIAGNOSIS — Z7901 Long term (current) use of anticoagulants: Secondary | ICD-10-CM

## 2013-08-03 DIAGNOSIS — Z79899 Other long term (current) drug therapy: Secondary | ICD-10-CM

## 2013-08-03 DIAGNOSIS — I10 Essential (primary) hypertension: Secondary | ICD-10-CM | POA: Diagnosis present

## 2013-08-03 DIAGNOSIS — M549 Dorsalgia, unspecified: Secondary | ICD-10-CM

## 2013-08-03 DIAGNOSIS — Z8249 Family history of ischemic heart disease and other diseases of the circulatory system: Secondary | ICD-10-CM

## 2013-08-03 LAB — CBC WITH DIFFERENTIAL/PLATELET
Basophils Relative: 0 % (ref 0–1)
Eosinophils Absolute: 0 10*3/uL (ref 0.0–0.7)
Eosinophils Relative: 0 % (ref 0–5)
Lymphs Abs: 1.4 10*3/uL (ref 0.7–4.0)
MCH: 31.3 pg (ref 26.0–34.0)
MCHC: 34.2 g/dL (ref 30.0–36.0)
MCV: 91.4 fL (ref 78.0–100.0)
Monocytes Relative: 7 % (ref 3–12)
Neutro Abs: 18.8 10*3/uL — ABNORMAL HIGH (ref 1.7–7.7)
Neutrophils Relative %: 86 % — ABNORMAL HIGH (ref 43–77)
Platelets: 254 10*3/uL (ref 150–400)
RBC: 4.32 MIL/uL (ref 3.87–5.11)
RDW: 14 % (ref 11.5–15.5)
WBC: 21.7 10*3/uL — ABNORMAL HIGH (ref 4.0–10.5)

## 2013-08-03 LAB — BASIC METABOLIC PANEL
CO2: 24 mEq/L (ref 19–32)
Chloride: 95 mEq/L — ABNORMAL LOW (ref 96–112)
Creatinine, Ser: 1.24 mg/dL — ABNORMAL HIGH (ref 0.50–1.10)
GFR calc Af Amer: 47 mL/min — ABNORMAL LOW (ref >90)
Glucose, Bld: 132 mg/dL — ABNORMAL HIGH (ref 70–99)
Potassium: 3.8 mEq/L (ref 3.5–5.1)
Sodium: 131 mEq/L — ABNORMAL LOW (ref 135–145)

## 2013-08-03 LAB — LACTIC ACID, PLASMA: Lactic Acid, Venous: 2 mmol/L (ref 0.5–2.2)

## 2013-08-03 LAB — URINALYSIS, ROUTINE W REFLEX MICROSCOPIC
Glucose, UA: NEGATIVE mg/dL
Ketones, ur: NEGATIVE mg/dL
Nitrite: NEGATIVE
Protein, ur: 100 mg/dL — AB
pH: 6 (ref 5.0–8.0)

## 2013-08-03 LAB — URINE MICROSCOPIC-ADD ON

## 2013-08-03 LAB — PROTIME-INR: INR: 2.2 — ABNORMAL HIGH (ref 0.00–1.49)

## 2013-08-03 LAB — TROPONIN I: Troponin I: <0.3 ng/mL (ref <0.30)

## 2013-08-03 MED ORDER — METOPROLOL TARTRATE 25 MG PO TABS
25.0000 mg | ORAL_TABLET | Freq: Two times a day (BID) | ORAL | Status: DC
  Administered 2013-08-03 – 2013-08-05 (×4): 25 mg via ORAL
  Filled 2013-08-03 (×4): qty 1

## 2013-08-03 MED ORDER — ACETAMINOPHEN 500 MG PO TABS
1000.0000 mg | ORAL_TABLET | Freq: Once | ORAL | Status: AC
  Administered 2013-08-03: 1000 mg via ORAL
  Filled 2013-08-03: qty 2

## 2013-08-03 MED ORDER — WARFARIN - PHARMACIST DOSING INPATIENT: Status: DC

## 2013-08-03 MED ORDER — PANTOPRAZOLE SODIUM 40 MG PO TBEC
80.0000 mg | DELAYED_RELEASE_TABLET | Freq: Every day | ORAL | Status: DC
  Administered 2013-08-03 – 2013-08-05 (×3): 80 mg via ORAL
  Filled 2013-08-03 (×3): qty 2

## 2013-08-03 MED ORDER — VERAPAMIL HCL ER 120 MG PO TBCR
120.0000 mg | EXTENDED_RELEASE_TABLET | Freq: Two times a day (BID) | ORAL | Status: DC
  Filled 2013-08-03 (×5): qty 1

## 2013-08-03 MED ORDER — SODIUM CHLORIDE 0.9 % IV SOLN
INTRAVENOUS | Status: DC
  Administered 2013-08-03: 19:00:00 via INTRAVENOUS

## 2013-08-03 MED ORDER — DEXTROSE 5 % IV SOLN
500.0000 mg | Freq: Once | INTRAVENOUS | Status: AC
  Administered 2013-08-03: 500 mg via INTRAVENOUS

## 2013-08-03 MED ORDER — SIMVASTATIN 20 MG PO TABS: 20.0000 mg | ORAL_TABLET | Freq: Every day | ORAL | Status: DC

## 2013-08-03 MED ORDER — TRAMADOL HCL 50 MG PO TABS
50.0000 mg | ORAL_TABLET | Freq: Three times a day (TID) | ORAL | Status: DC | PRN
  Administered 2013-08-03 – 2013-08-05 (×3): 50 mg via ORAL
  Filled 2013-08-03 (×3): qty 1

## 2013-08-03 MED ORDER — ALUM & MAG HYDROXIDE-SIMETH 200-200-20 MG/5ML PO SUSP: 30.0000 mL | Freq: Four times a day (QID) | ORAL | Status: DC | PRN

## 2013-08-03 MED ORDER — AMIODARONE HCL 200 MG PO TABS
200.0000 mg | ORAL_TABLET | Freq: Two times a day (BID) | ORAL | Status: DC
Start: 2013-08-03 — End: 2013-08-05
  Administered 2013-08-03 – 2013-08-05 (×4): 200 mg via ORAL
  Filled 2013-08-03 (×4): qty 1

## 2013-08-03 MED ORDER — WARFARIN SODIUM 2 MG PO TABS
4.5000 mg | ORAL_TABLET | Freq: Once | ORAL | Status: AC
  Administered 2013-08-03: 4.5 mg via ORAL
  Filled 2013-08-03 (×2): qty 1

## 2013-08-03 MED ORDER — ONDANSETRON HCL 4 MG PO TABS: 4.0000 mg | ORAL_TABLET | Freq: Four times a day (QID) | ORAL | Status: DC | PRN

## 2013-08-03 MED ORDER — ACETAMINOPHEN 325 MG PO TABS
650.0000 mg | ORAL_TABLET | Freq: Four times a day (QID) | ORAL | Status: DC | PRN
  Administered 2013-08-04: 650 mg via ORAL
  Filled 2013-08-03: qty 2

## 2013-08-03 MED ORDER — FLUTICASONE PROPIONATE 50 MCG/ACT NA SUSP
NASAL | Status: AC
  Filled 2013-08-03: qty 16

## 2013-08-03 MED ORDER — SODIUM CHLORIDE 0.9 % IV SOLN
INTRAVENOUS | Status: DC
  Administered 2013-08-03: 14:00:00 via INTRAVENOUS

## 2013-08-03 MED ORDER — ALBUTEROL SULFATE (5 MG/ML) 0.5% IN NEBU
INHALATION_SOLUTION | RESPIRATORY_TRACT | Status: AC
  Administered 2013-08-04: 2.5 mg
  Filled 2013-08-03: qty 0.5

## 2013-08-03 MED ORDER — ATORVASTATIN CALCIUM 10 MG PO TABS: 10.0000 mg | ORAL_TABLET | Freq: Every day | ORAL | Status: DC

## 2013-08-03 MED ORDER — DEXTROSE 5 % IV SOLN
1.0000 g | INTRAVENOUS | Status: DC
  Administered 2013-08-04: 1 g via INTRAVENOUS
  Filled 2013-08-03 (×2): qty 10

## 2013-08-03 MED ORDER — DEXTROSE 5 % IV SOLN
500.0000 mg | INTRAVENOUS | Status: DC
  Administered 2013-08-04: 500 mg via INTRAVENOUS
  Filled 2013-08-03 (×2): qty 500

## 2013-08-03 MED ORDER — ONDANSETRON HCL 4 MG/2ML IJ SOLN: 4.0000 mg | Freq: Four times a day (QID) | INTRAMUSCULAR | Status: DC | PRN

## 2013-08-03 MED ORDER — ACETAMINOPHEN 650 MG RE SUPP: 650.0000 mg | Freq: Four times a day (QID) | RECTAL | Status: DC | PRN

## 2013-08-03 MED ORDER — ALBUTEROL SULFATE (5 MG/ML) 0.5% IN NEBU
2.5000 mg | INHALATION_SOLUTION | RESPIRATORY_TRACT | Status: DC | PRN
  Administered 2013-08-03: 2.5 mg via RESPIRATORY_TRACT
  Filled 2013-08-03: qty 0.5

## 2013-08-03 MED ORDER — TRAZODONE HCL 50 MG PO TABS
25.0000 mg | ORAL_TABLET | Freq: Every day | ORAL | Status: DC
  Administered 2013-08-03 – 2013-08-04 (×2): 25 mg via ORAL
  Filled 2013-08-03 (×2): qty 1

## 2013-08-03 MED ORDER — FLUTICASONE PROPIONATE 50 MCG/ACT NA SUSP
2.0000 | Freq: Every day | NASAL | Status: DC
  Administered 2013-08-04 – 2013-08-05 (×2): 2 via NASAL
  Filled 2013-08-03 (×2): qty 16

## 2013-08-03 MED ORDER — DEXTROSE 5 % IV SOLN
1.0000 g | Freq: Once | INTRAVENOUS | Status: AC
  Administered 2013-08-03: 1 g via INTRAVENOUS
  Filled 2013-08-03: qty 10
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
## 2013-08-03 NOTE — ED Notes (Signed)
MD at bedside. Dr Rubin Payor at bedside, examined pt and discussed plan of care with said pt.

## 2013-08-03 NOTE — ED Notes (Signed)
MD at bedside. Dr Irene Limbo at bedside discussing plan of care with pt and family.

## 2013-08-03 NOTE — ED Notes (Signed)
Pt arrived by ems from homes, reports that she has been sick since Thursday w/ cough, fever and left side rib pain. Right shoulder pain.  Also thinks she may have a uti, has noted blood in her urine and odor to her urine for several days. Pt alert and oriented at arrival and stated she is also weak.

## 2013-08-03 NOTE — Progress Notes (Signed)
ANTIBIOTIC CONSULT NOTE - INITIAL  Pharmacy Consult for Renal Adjustment Antibiotics  Indication: CAP, possibly developing sepsis  Allergies  Allergen Reactions  . Codeine Nausea And Vomiting  . Lopid [Gemfibrozil] Other (See Comments)    States that she became hyper after taking this medication  . Naprosyn [Naproxen] Other (See Comments)    Upset stomach    . Percocet [Oxycodone-Acetaminophen] Nausea And Vomiting    Patient Measurements: Height: 5' 5.5" (166.4 cm) Weight: 160 lb (72.576 kg) IBW/kg (Calculated) : 58.15 Adjusted Body Weight:   Vital Signs: Temp: 98.4 F (36.9 C) (11/02 1634) Temp src: Oral (11/02 1634) BP: 100/53 mmHg (11/02 1634) Pulse Rate: 78 (11/02 1634) Intake/Output from previous day:   Intake/Output from this shift:    Labs:  Recent Labs  08/03/13 1419  WBC 21.7*  HGB 13.5  PLT 254  CREATININE 1.24*   Estimated Creatinine Clearance: 37.2 ml/min (by C-G formula based on Cr of 1.24). No results found for this basename: VANCOTROUGH, VANCOPEAK, VANCORANDOM, GENTTROUGH, GENTPEAK, GENTRANDOM, TOBRATROUGH, TOBRAPEAK, TOBRARND, AMIKACINPEAK, AMIKACINTROU, AMIKACIN,  in the last 72 hours   Microbiology: No results found for this or any previous visit (from the past 720 hour(s)).  Medical History: Past Medical History  Diagnosis Date  . Atrial flutter   . Tachycardia-bradycardia syndrome   . HTN (hypertension)   . Chronic anticoagulation   . Small bowel problem 12/2007  . AMI (acute myocardial infarction)   . Pacemaker   . DVT (deep venous thrombosis)     arm after pacemaker placement  . GI bleeding   . Hyperlipidemia   . DDD (degenerative disc disease), lumbar   . DJD (degenerative joint disease) of knee     bilateral    . DJD (degenerative joint disease)     hips   . Chronic pain   . Back pain   . Hypertension   . Anemia   . COPD (chronic obstructive pulmonary disease)   . Peripheral vascular disease     Medications:   Scheduled:  . amiodarone  200 mg Oral BID  . atorvastatin  10 mg Oral q1800  . [START ON 08/04/2013] azithromycin  500 mg Intravenous Q24H  . [START ON 08/04/2013] cefTRIAXone (ROCEPHIN)  IV  1 g Intravenous Q24H  . fluticasone  2 spray Each Nare Daily  . metoprolol tartrate  25 mg Oral BID  . pantoprazole  80 mg Oral Daily  . traZODone  25 mg Oral QHS  . verapamil  120 mg Oral BID  . warfarin  4.5 mg Oral ONCE-1800  . [START ON 08/04/2013] Warfarin - Pharmacist Dosing Inpatient   Does not apply Q24H   Assessment: Azithromycin 500 mg IV every 24 hours per MD Rocephin 1 GM IV every 24 hours per MD  Goal of Therapy:  Eradicate infection  Plan:  Continue Azithromycin 500 mg IV every 24 hours Continue Rocephin 1 GM IV every 24 hours No renal adjustment necessary   Raquel James, Reyes Aldaco Bennett 08/03/2013,5:53 PM
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
## 2013-08-03 NOTE — ED Notes (Signed)
Pt presents with Rt shoulder pain, left rib pain, cough and hematuria with fever. Pt states pain and decreased breathing started on Friday morning. EKG completed with no acute changes noted. Pt placed on [place] for O2 sats of 91 on room air. Pt given tylenol for oral temp of 102. EDP aware of pt's arrival. Pt is alert and oriented at this time. Side rales up per pt safety.

## 2013-08-03 NOTE — Progress Notes (Signed)
ANTICOAGULATION CONSULT NOTE - Initial Consult  Pharmacy Consult for Warfarin Indication: atrial fibrillation  Allergies  Allergen Reactions  . Codeine Nausea And Vomiting  . Lopid [Gemfibrozil] Other (See Comments)    States that she became hyper after taking this medication  . Naprosyn [Naproxen] Other (See Comments)    Upset stomach    . Percocet [Oxycodone-Acetaminophen] Nausea And Vomiting    Patient Measurements: Height: 5' 5.5" (166.4 cm) Weight: 160 lb (72.576 kg) IBW/kg (Calculated) : 58.15 Heparin Dosing Weight:   Vital Signs: Temp: 98.4 F (36.9 C) (11/02 1634) Temp src: Oral (11/02 1634) BP: 100/53 mmHg (11/02 1634) Pulse Rate: 78 (11/02 1634)  Labs:  Recent Labs  08/03/13 1419  HGB 13.5  HCT 39.5  PLT 254  LABPROT 23.7*  INR 2.20*  CREATININE 1.24*  TROPONINI <0.30    Estimated Creatinine Clearance: 37.2 ml/min (by C-G formula based on Cr of 1.24).   Medical History: Past Medical History  Diagnosis Date  . Atrial flutter   . Tachycardia-bradycardia syndrome   . HTN (hypertension)   . Chronic anticoagulation   . Small bowel problem 12/2007  . AMI (acute myocardial infarction)   . Pacemaker   . DVT (deep venous thrombosis)     arm after pacemaker placement  . GI bleeding   . Hyperlipidemia   . DDD (degenerative disc disease), lumbar   . DJD (degenerative joint disease) of knee     bilateral    . DJD (degenerative joint disease)     hips   . Chronic pain   . Back pain   . Hypertension   . Anemia   . COPD (chronic obstructive pulmonary disease)   . Peripheral vascular disease     Medications:  Scheduled:  . amiodarone  200 mg Oral BID  . atorvastatin  10 mg Oral q1800  . [START ON 08/04/2013] azithromycin  500 mg Intravenous Q24H  . [START ON 08/04/2013] cefTRIAXone (ROCEPHIN)  IV  1 g Intravenous Q24H  . fluticasone  2 spray Each Nare Daily  . metoprolol tartrate  25 mg Oral BID  . pantoprazole  80 mg Oral Daily  . traZODone  25  mg Oral QHS  . verapamil  120 mg Oral BID  . warfarin  4.5 mg Oral ONCE-1800  . [START ON 08/04/2013] Warfarin - Pharmacist Dosing Inpatient   Does not apply Q24H    Assessment: Continuation of warfarin PTA for atrial fibrillation PTA Warfarin 3 mg on Mon,Wed,Fri, 4.5 mg on Tues,Thur,Sat,Sun INR therapeutic on admission  Goal of Therapy:  INR 2-3 Monitor platelets by anticoagulation protocol: Yes   Plan:  Warfarin 4.5 mg po today, home dose INR/PT daily CBC, monitor platelets Labs per protocol  [name], [name] Bennett 08/03/2013,5:48 PM

## 2013-08-04 DIAGNOSIS — J9600 Acute respiratory failure, unspecified whether with hypoxia or hypercapnia: Secondary | ICD-10-CM

## 2013-08-04 LAB — BASIC METABOLIC PANEL
BUN: 21 mg/dL (ref 6–23)
CO2: 26 mEq/L (ref 19–32)
Calcium: 8.7 mg/dL (ref 8.4–10.5)
Creatinine, Ser: 1.12 mg/dL — ABNORMAL HIGH (ref 0.50–1.10)
GFR calc Af Amer: 53 mL/min — ABNORMAL LOW (ref >90)
GFR calc non Af Amer: 45 mL/min — ABNORMAL LOW (ref >90)
Potassium: 4.1 mEq/L (ref 3.5–5.1)
Sodium: 135 mEq/L (ref 135–145)

## 2013-08-04 LAB — CBC
HCT: 35.3 % — ABNORMAL LOW (ref 36.0–46.0)
MCV: 92.2 fL (ref 78.0–100.0)
Platelets: 256 10*3/uL (ref 150–400)
RBC: 3.83 MIL/uL — ABNORMAL LOW (ref 3.87–5.11)
RDW: 14.4 % (ref 11.5–15.5)
WBC: 17.6 10*3/uL — ABNORMAL HIGH (ref 4.0–10.5)

## 2013-08-04 LAB — STREP PNEUMONIAE URINARY ANTIGEN: Strep Pneumo Urinary Antigen: NEGATIVE

## 2013-08-04 LAB — PROTIME-INR
INR: 2.89 — ABNORMAL HIGH (ref 0.00–1.49)
Prothrombin Time: 29.2 seconds — ABNORMAL HIGH (ref 11.6–15.2)

## 2013-08-04 MED ORDER — WARFARIN SODIUM 2 MG PO TABS
3.0000 mg | ORAL_TABLET | Freq: Once | ORAL | Status: AC
  Administered 2013-08-04: 3 mg via ORAL
  Filled 2013-08-04 (×2): qty 1

## 2013-08-04 MED ORDER — VERAPAMIL HCL ER 240 MG PO TBCR: 120.0000 mg | EXTENDED_RELEASE_TABLET | Freq: Two times a day (BID) | ORAL | Status: DC

## 2013-08-04 MED ORDER — VERAPAMIL HCL ER 120 MG PO CP24
120.0000 mg | ORAL_CAPSULE | Freq: Two times a day (BID) | ORAL | Status: DC
  Administered 2013-08-04: 120 mg via ORAL
  Filled 2013-08-04 (×5): qty 1

## 2013-08-04 MED ORDER — VERAPAMIL HCL ER 240 MG PO TBCR
120.0000 mg | EXTENDED_RELEASE_TABLET | Freq: Two times a day (BID) | ORAL | Status: DC
  Administered 2013-08-04 – 2013-08-05 (×2): 120 mg via ORAL
  Filled 2013-08-04 (×2): qty 1

## 2013-08-04 NOTE — Care Management Note (Signed)
    Page 1 of 1   08/04/2013     2:10:54 PM   CARE MANAGEMENT NOTE 08/04/2013  Patient:  [name], [name]   Account Number:  [id number]  Date Initiated:  08/04/2013  Documentation initiated by:  Rosemary Holms  Subjective/Objective Assessment:   Pt admitted from home where she lives with her spouse. Plans to return home. Previously had [facility] in the past but does not feel she will need asssitance when she is dc'd. If she does would like [facility]     Action/Plan:   Anticipated DC Date:  08/06/2013   Anticipated DC Plan:  HOME/SELF CARE      DC Planning Services  CM consult      Choice offered to / List presented to:             Status of service:  Completed, signed off Medicare Important Message given?   (If response is "NO", the following Medicare IM given date fields will be blank) Date Medicare IM given:   Date Additional Medicare IM given:    Discharge Disposition:    Per UR Regulation:    If discussed at Long Length of Stay Meetings, dates discussed:    Comments:  08/04/13 Rosemary Holms RN BSN CM

## 2013-08-04 NOTE — Progress Notes (Signed)
ANTICOAGULATION CONSULT NOTE - follow up  Pharmacy Consult for Warfarin Indication: atrial fibrillation  Allergies  Allergen Reactions  . Codeine Nausea And Vomiting  . Lopid [Gemfibrozil] Other (See Comments)    States that she became hyper after taking this medication  . Naprosyn [Naproxen] Other (See Comments)    Upset stomach    . Percocet [Oxycodone-Acetaminophen] Nausea And Vomiting   Patient Measurements: Height: 5' 5.5" (166.4 cm) Weight: 166 lb 10.7 oz (75.6 kg) IBW/kg (Calculated) : 58.15 Heparin Dosing Weight:   Vital Signs: Temp: 99.1 F (37.3 C) (11/03 0526) Temp src: Oral (11/03 0526) BP: 123/80 mmHg (11/03 0526) Pulse Rate: 84 (11/03 0526)  Labs:  Recent Labs  08/03/13 1419 08/04/13 0609  HGB 13.5 11.8*  HCT 39.5 35.3*  PLT 254 256  LABPROT 23.7* 29.2*  INR 2.20* 2.89*  CREATININE 1.24* 1.12*  TROPONINI <0.30  --    Estimated Creatinine Clearance: 41.9 ml/min (by C-G formula based on Cr of 1.12).  Medical History: Past Medical History  Diagnosis Date  . Atrial flutter   . Tachycardia-bradycardia syndrome   . HTN (hypertension)   . Chronic anticoagulation   . Small bowel problem 12/2007  . AMI (acute myocardial infarction)   . Pacemaker   . DVT (deep venous thrombosis)     arm after pacemaker placement  . GI bleeding   . Hyperlipidemia   . DDD (degenerative disc disease), lumbar   . DJD (degenerative joint disease) of knee     bilateral    . DJD (degenerative joint disease)     hips   . Chronic pain   . Back pain   . Hypertension   . Anemia   . COPD (chronic obstructive pulmonary disease)   . Peripheral vascular disease    Medications:  Scheduled:  . amiodarone  200 mg Oral BID  . azithromycin  500 mg Intravenous Q24H  . cefTRIAXone (ROCEPHIN)  IV  1 g Intravenous Q24H  . fluticasone  2 spray Each Nare Daily  . metoprolol tartrate  25 mg Oral BID  . pantoprazole  80 mg Oral Daily  . traZODone  25 mg Oral QHS  . verapamil  120  mg Oral BID  . Warfarin - Pharmacist Dosing Inpatient   Does not apply Q24H   Assessment: Continuation of warfarin PTA for atrial fibrillation PTA Warfarin 3 mg on Mon,Wed,Fri, 4.5 mg on Tues,Thur,Sat,Sun INR therapeutic.  Pt is also on Amiodarone.  Goal of Therapy:  INR 2-3 Monitor platelets by anticoagulation protocol: Yes   Plan:  Warfarin 3 mg po today, home dose INR/PT daily CBC, monitor platelets Labs per protocol  Valrie Hart A 08/04/2013,11:15 AM

## 2013-08-04 NOTE — Progress Notes (Signed)
[facility] PROGRESS NOTE  [name] ZOX:[id number] DOB: 09-Mar-1931 DOA: 08/03/2013 PCP: Rudi Heap, MD  Assessment/Plan: 1. CAP with sepsis: Max temp 99.1. White count trending downward. Hemodynamically stable. Legionella antigen and strep pneumo pending.  Will continue Rocephin and Zithromax day #2.  Sputum culture pending. Will wean oxygen. Up to chair.  2. Possible acute hypoxic respiratory failure: improved. sats >90% on 2L. Will wean oxygen and monitor 3. Acute renal failure: Probably secondary to dehydration rather than ATN. Improving. Will continue IV fluids at lower rate. Will continue to hold home lisinopril lasix. Repeat basic metabolic panel in the morning. 4. Atrial fibrillation/flutter: rate controlled.INR 2.89.  Continue amiodarone, Lopressor, verapamil. Warfarin per pharmacy. Pacemaker in place.    Code Status: full Family Communication: husband and niece at bedside Disposition Plan: home when ready   Consultants:  none  Procedures:  none  Antibiotics:  Rocephin 08/04/13>>>  zithromax 08/04/13 >>>  HPI/Subjective: Sitting up in bed visiting with family. Reports improved sob. Still with some discomfort with deep inspiration.   Objective: Filed Vitals:   08/04/13 0526  BP: 123/80  Pulse: 84  Temp: 99.1 F (37.3 C)  Resp: 20   No intake or output data in the 24 hours ending 08/04/13 1202 Filed Weights   08/03/13 1327 08/03/13 1838  Weight: 72.576 kg (160 lb) 75.6 kg (166 lb 10.7 oz)    Exam:   General:  Well nourished NAD  Cardiovascular: RRR no MGR No LE edema   Respiratory: very mild increased work of breathing with conversation. BS distant but clear. No wheeze  Abdomen: round soft +BS non-tender  Musculoskeletal: no clubbing no cyanosis   Data Reviewed: Basic Metabolic Panel:  Recent Labs Lab 08/03/13 1419 08/04/13 0609  NA 131* 135  K 3.8 4.1  CL 95* 100  CO2 24 26  GLUCOSE 132* 107*  BUN 25* 21  CREATININE  1.24* 1.12*  CALCIUM 9.6 8.7   Liver Function Tests: No results found for this basename: AST, ALT, ALKPHOS, BILITOT, PROT, ALBUMIN,  in the last 168 hours No results found for this basename: LIPASE, AMYLASE,  in the last 168 hours No results found for this basename: AMMONIA,  in the last 168 hours CBC:  Recent Labs Lab 08/03/13 1419 08/04/13 0609  WBC 21.7* 17.6*  NEUTROABS 18.8*  --   HGB 13.5 11.8*  HCT 39.5 35.3*  MCV 91.4 92.2  PLT 254 256   Cardiac Enzymes:  Recent Labs Lab 08/03/13 1419  TROPONINI <0.30   BNP (last 3 results)  Recent Labs  05/09/13 1330  PROBNP 1249.0*   CBG: No results found for this basename: GLUCAP,  in the last 168 hours  No results found for this or any previous visit (from the past 240 hour(s)).   Studies: Dg Chest 2 View  08/03/2013   CLINICAL DATA:  Fever. Shortness of breath. Right chest and shoulder pain.  EXAM: CHEST  2 VIEW  COMPARISON:  05/09/2013  FINDINGS: Mild cardiomegaly and pulmonary vascular congestion are stable. Dual lead transvenous pacemaker remains in appropriate position.  Increased opacity is seen in the region of the lingula which is most pronounced on the lateral projection, suspicious for pneumonia. No evidence of pleural effusion.  IMPRESSION: Increased lingular opacity, suspicious for pneumonia. Recommend short-term radiographic followup in several weeks to confirm resolution.  Stable cardiomegaly and pulmonary vascular congestion.   Electronically Signed   By: Myles Rosenthal M.D.   On: 08/03/2013 14:40    Scheduled Meds: .  amiodarone  200 mg Oral BID  . azithromycin  500 mg Intravenous Q24H  . cefTRIAXone (ROCEPHIN)  IV  1 g Intravenous Q24H  . fluticasone  2 spray Each Nare Daily  . metoprolol tartrate  25 mg Oral BID  . pantoprazole  80 mg Oral Daily  . traZODone  25 mg Oral QHS  . verapamil  120 mg Oral BID  . warfarin  3 mg Oral Once  . Warfarin - Pharmacist Dosing Inpatient   Does not apply Q24H    Continuous Infusions: . sodium chloride 75 mL/hr at 08/03/13 1848    Principal Problem:   Sepsis Active Problems:   Atrial fibrillation   PPM-[employer]   Chronic anticoagulation   Community acquired pneumonia   Acute respiratory failure with hypoxia    Time spent: 35 minutes    [facility] M  [facility] Pager [phone]. If 7PM-7AM, please contact night-coverage at [email or web address], password [facility] 08/04/2013, 12:02 PM  LOS: 1 day

## 2013-08-04 NOTE — Progress Notes (Signed)
Utilization Review Complete  

## 2013-08-04 NOTE — Evaluation (Signed)
Physical Therapy Evaluation Patient Details Name: [name] MRN: [id number] DOB: 1931-08-27 Today's Date: 08/04/2013 Time: 4098-1191 PT Time Calculation (min): 22 min  PT Assessment / Plan / Recommendation History of Present Illness   Ma. Hoganson is normally I.  She was admitted to [facility] with CAP and referred to therapy to ensure that she has not lost her ability to ambulate safely I.  Clinical Impression  Pt has decreased endurance becoming SOB when ambulating for 100 ft.  Pt demonstrated no loss of balance when ambulating and was able to demonstrate positional changes without using any assistive device.     PT Assessment  Patient needs continued PT services    Follow Up Recommendations  No PT follow up    Does the patient have the potential to tolerate intense rehabilitation    N/A  Barriers to Discharge  none      Equipment Recommendations  None recommended by PT    Recommendations for Other Services   none  Frequency Min 2X/week    Precautions / Restrictions Precautions Precautions: None Restrictions Weight Bearing Restrictions: No   Pertinent Vitals/Pain none      Mobility  Bed Mobility Bed Mobility: Supine to Sit Supine to Sit: 6: Modified independent (Device/Increase time) Transfers Transfers: Sit to Stand Sit to Stand: 6: Modified independent (Device/Increase time) Ambulation/Gait Ambulation/Gait Assistance: 6: Modified independent (Device/Increase time) Ambulation Distance (Feet): 100 Feet Assistive device: None Gait Pattern: Within Functional Limits    Exercises  none completed today   PT Diagnosis: Generalized weakness  PT Problem List: Decreased activity tolerance PT Treatment Interventions: Gait training;Therapeutic exercise     PT Goals(Current goals can be found in the care plan section)    Visit Information  Last PT Received On: 08/04/13       Prior Functioning  Home Living Family/patient expects to be discharged to:: Private  residence Living Arrangements: Spouse/significant other Available Help at Discharge: Family Type of Home: House Home Access: Stairs to enter Secretary/administrator of Steps: 2 Entrance Stairs-Rails: Right Home Layout: One level Home Equipment: None Communication Communication: No difficulties Dominant Hand: Right    Cognition  Cognition Arousal/Alertness: Awake/alert Behavior During Therapy: WFL for tasks assessed/performed Overall Cognitive Status: Within Functional Limits for tasks assessed    Extremity/Trunk Assessment Lower Extremity Assessment Lower Extremity Assessment: Overall WFL for tasks assessed   Balance Balance Balance Assessed: No  End of Session PT - End of Session Equipment Utilized During Treatment: Gait belt Activity Tolerance: Patient tolerated treatment well (Pt SOB but ambulated without 02 as 02 was not on when therapist came into pt room.  Therapist realized 02 was on after ambulation was completed; therapist questioned pt about 02; pt states that nursing staff took it off to take her to restroom and never placed it back on her and she did not question it.  Therapist placed 02 back on pt) Patient left: in chair;with call bell/phone within reach;with nursing/sitter in room Nurse Communication: Mobility status  GP     RUSSELL,CINDY 08/04/2013, 1:42 PM

## 2013-08-04 NOTE — Progress Notes (Signed)
Patient seen, independently examined and chart reviewed. I agree with exam, assessment and plan discussed with Toya Smothers, NP.  Feels better today. More energy. Able to take a deeper breath. Appears calm on examination. Exam benign.  Overall improving. Continue empiric treatment for community-acquired pneumonia and followup culture data. Wean oxygen as tolerated. Likely home in 48 hours.   Brendia Sacks, MD [facility] [phone]

## 2013-08-05 LAB — BASIC METABOLIC PANEL
CO2: 25 mEq/L (ref 19–32)
Calcium: 9.1 mg/dL (ref 8.4–10.5)
Chloride: 100 mEq/L (ref 96–112)
Potassium: 3.8 mEq/L (ref 3.5–5.1)
Sodium: 135 mEq/L (ref 135–145)

## 2013-08-05 LAB — CBC
HCT: 33 % — ABNORMAL LOW (ref 36.0–46.0)
Hemoglobin: 11.1 g/dL — ABNORMAL LOW (ref 12.0–15.0)
MCHC: 33.6 g/dL (ref 30.0–36.0)
MCV: 91.9 fL (ref 78.0–100.0)
Platelets: 273 10*3/uL (ref 150–400)
RBC: 3.59 MIL/uL — ABNORMAL LOW (ref 3.87–5.11)
WBC: 14.4 10*3/uL — ABNORMAL HIGH (ref 4.0–10.5)

## 2013-08-05 LAB — LEGIONELLA ANTIGEN, URINE: Legionella Antigen, Urine: NEGATIVE

## 2013-08-05 LAB — PROTIME-INR: INR: 3.92 — ABNORMAL HIGH (ref 0.00–1.49)

## 2013-08-05 MED ORDER — FUROSEMIDE 10 MG/ML IJ SOLN
20.0000 mg | Freq: Once | INTRAMUSCULAR | Status: AC
  Administered 2013-08-05: 20 mg via INTRAVENOUS
  Filled 2013-08-05: qty 2

## 2013-08-05 MED ORDER — AZITHROMYCIN 500 MG PO TABS: 500.0000 mg | ORAL_TABLET | Freq: Every day | ORAL | Status: AC

## 2013-08-05 MED ORDER — CEFUROXIME AXETIL 500 MG PO TABS: 500.0000 mg | ORAL_TABLET | Freq: Two times a day (BID) | ORAL | Status: AC

## 2013-08-05 MED ORDER — WARFARIN SODIUM 2 MG PO TABS: 2.0000 mg | ORAL_TABLET | Freq: Every day | ORAL | Status: DC

## 2013-08-05 NOTE — Progress Notes (Signed)
ANTICOAGULATION CONSULT NOTE - follow up  Pharmacy Consult for Warfarin Indication: atrial fibrillation  Allergies  Allergen Reactions  . Codeine Nausea And Vomiting  . Lopid [Gemfibrozil] Other (See Comments)    States that she became hyper after taking this medication  . Naprosyn [Naproxen] Other (See Comments)    Upset stomach    . Percocet [Oxycodone-Acetaminophen] Nausea And Vomiting   Patient Measurements: Height: 5' 5.5" (166.4 cm) Weight: 166 lb 10.7 oz (75.6 kg) IBW/kg (Calculated) : 58.15  Vital Signs: Temp: 98.1 F (36.7 C) (11/04 0603) Temp src: Oral (11/04 0603) BP: 120/77 mmHg (11/04 0603) Pulse Rate: 95 (11/04 0603)  Labs:  Recent Labs  08/03/13 1419 08/04/13 0609 08/05/13 0517  HGB 13.5 11.8* 11.1*  HCT 39.5 35.3* 33.0*  PLT 254 256 273  LABPROT 23.7* 29.2* 36.9*  INR 2.20* 2.89* 3.92*  CREATININE 1.24* 1.12* 1.01  TROPONINI <0.30  --   --    Estimated Creatinine Clearance: 46.5 ml/min (by C-G formula based on Cr of 1.01).  Medical History: Past Medical History  Diagnosis Date  . Atrial flutter   . Tachycardia-bradycardia syndrome   . HTN (hypertension)   . Chronic anticoagulation   . Small bowel problem 12/2007  . AMI (acute myocardial infarction)   . Pacemaker   . DVT (deep venous thrombosis)     arm after pacemaker placement  . GI bleeding   . Hyperlipidemia   . DDD (degenerative disc disease), lumbar   . DJD (degenerative joint disease) of knee     bilateral    . DJD (degenerative joint disease)     hips   . Chronic pain   . Back pain   . Hypertension   . Anemia   . COPD (chronic obstructive pulmonary disease)   . Peripheral vascular disease    Medications:  Scheduled:  . amiodarone  200 mg Oral BID  . azithromycin  500 mg Intravenous Q24H  . cefTRIAXone (ROCEPHIN)  IV  1 g Intravenous Q24H  . fluticasone  2 spray Each Nare Daily  . metoprolol tartrate  25 mg Oral BID  . pantoprazole  80 mg Oral Daily  . traZODone  25 mg  Oral QHS  . verapamil  120 mg Oral BID  . Warfarin - Pharmacist Dosing Inpatient   Does not apply Q24H   Assessment: Continuation of warfarin PTA for atrial fibrillation PTA Warfarin 3 mg on Mon,Wed,Fri, 4.5 mg on Tues,Thur,Sat,Sun INR has risen to supra-therapeutic level today most likely due to acute illness vs drug interactions.  Pt is on Amiodarone and Zithromax which can cause INR increase.  Goal of Therapy:  INR 2-3 Monitor platelets by anticoagulation protocol: Yes   Plan:  HOLD coumadin today INR/PT daily CBC, monitor platelets Labs per protocol  Valrie Hart A 08/05/2013,10:45 AM

## 2013-08-05 NOTE — Discharge Summary (Signed)
Patient seen, independently examined and chart reviewed. I agree with exam, assessment and plan discussed with Toya Smothers, NP.  Patient's much better today. Did very well physical therapy. She is off oxygen. Breathing better and feels well. Stable for discharge. Complete antibiotics as an outpatient. Followup tomorrow for PT/INR checked. Patient was instructed to hold Coumadin today.  Brendia Sacks, MD [facility] [phone]

## 2013-08-05 NOTE — Progress Notes (Signed)
Patient with orders to be discharge home. Discharge instructions given. Prescriptions given. Patient stable. Patient left in private vehicle with family.

## 2013-08-05 NOTE — Progress Notes (Signed)
Physical Therapy Treatment Patient Details Name: [name] MRN: [id number] DOB: 1931/02/14 Today's Date: 08/05/2013 Time: 5621-3086 PT Time Calculation (min): 23 min  PT Assessment / Plan / Recommendation  History of Present Illness Pt states that she has been up in a chair all morning, off of O2.  She is feeling better.   PT Comments   Pt reports having a long hx of chronic back dysfunction with bulging discs in lumbar spine.  She is not interested in doing much formal exercise, prefers to walk.  She was instructed in LAQ and seated hip flexion in order to improve gait endurance and strength...she was advised that this would not stress her back.  Her gait is stable with no assistive device.  O2 sat on RA at rest was 93%, with exertion (gait) it was 90%.  She was instructed in energy conservation.  She tolerated PT visit well. t  Follow Up Recommendations  No PT follow up     Does the patient have the potential to tolerate intense rehabilitation     Barriers to Discharge        Equipment Recommendations       Recommendations for Other Services    Frequency     Progress towards PT Goals Progress towards PT goals: Progressing toward goals  Plan Current plan remains appropriate    Precautions / Restrictions     Pertinent Vitals/Pain     Mobility  Transfers Sit to Stand: 6: Modified independent (Device/Increase time) Ambulation/Gait Ambulation/Gait Assistance: 6: Modified independent (Device/Increase time) Ambulation Distance (Feet): 100 Feet Assistive device: None Gait Pattern: Within Functional Limits Gait velocity: slow pace, appropriate for her respiratory status Stairs: No Wheelchair Mobility Wheelchair Mobility: No    Exercises General Exercises - Lower Extremity Long Arc Quad: AROM;Both;10 reps;Seated Hip Flexion/Marching: AROM;Both;20 reps;Seated   PT Diagnosis:    PT Problem List:   PT Treatment Interventions:     PT Goals (current goals can now be  found in the care plan section)    Visit Information  Last PT Received On: 08/05/13 History of Present Illness: Pt states that she has been up in a chair all morning, off of O2.  She is feeling better.    Subjective Data      Cognition  Cognition Arousal/Alertness: Awake/alert Behavior During Therapy: WFL for tasks assessed/performed Overall Cognitive Status: Within Functional Limits for tasks assessed    Balance     End of Session PT - End of Session Equipment Utilized During Treatment: Gait belt Activity Tolerance: Patient tolerated treatment well Patient left: in chair;with call bell/phone within reach   GP     Myrlene Broker L 08/05/2013, 11:51 AM

## 2013-08-05 NOTE — Discharge Summary (Signed)
Physician Discharge Summary  [name] ZOX:[id number] DOB: 1930/11/10 DOA: 08/03/2013  PCP: Rudi Heap, MD  Admit date: 08/03/2013 Discharge date: 08/05/2013  Time spent: 40  minutes  Recommendations for Outpatient Follow-up:  1. INR check with coumadin dosing 08/06/13 at 11:30 2. PCP follow up for evaluation of symptoms 08/19/13  Discharge Diagnoses:  Principal Problem:   Sepsis Active Problems:   Atrial fibrillation   PPM-[employer]   Chronic anticoagulation   Community acquired pneumonia   Acute respiratory failure with hypoxia   Discharge Condition: stable  Diet recommendation: heart healthy  Filed Weights   08/03/13 1327 08/03/13 1838  Weight: 72.576 kg (160 lb) 75.6 kg (166 lb 10.7 oz)    History of present illness:  79 year old woman presented to the emergency department on 08/03/13 for left-sided rib pain, right shoulder pain, fever, cough productive. Initial evaluation suggested developing sepsis secondary to community-acquired pneumonia and acute renal failure. History obtained from patient at bedside. She reported feeling poorly for the several days. 10/31 she woke up with fever and productive cough. Since that time she became progressively weaker and dyspneic on exertion. Cough continued. She had left-sided rib pain associated with this.  In the emergency department and noted to be febrile 102. Tachycardic. Possibly hypoxic. WBC 21.7 with left shift. BUN 25, creatinine 1.24, sodium 131. INR therapeutic 2.2. Chest x-ray with lingular pneumonia. EKG not acute. Treated with ceftriaxone, Zithromax, Tylenol and IV    Hospital Course:  1. CAP with sepsis: admitted to medical floor. Max temp 100.1. White count 21.7 on admission and 14.4 at discharge. Pt remained hemodynamically stable during this hospitalization. Legionella antigen and strep pneumo negative. Provided Rocephin and Zithromax for 2 days. Will be discharged with ceftin and zithromax. sputum culture with  gm+cocci in pairs in chains. Maintaining sats >90% on room air at discharge.   2. Possible acute hypoxic respiratory failure: supported with oxygen via [place].  Improved quickly. sats >90% on room air at discharge. 3. Acute renal failure: Probably secondary to dehydration rather than ATN. Supportive IV fluids. At discharge resolved. Home lisinopril and  Lasix held initially. Recommend BMET on 08/19/13 4. Atrial fibrillation/flutter: rate controlled.INR 2.89. Continue amiodarone, Lopressor, verapamil. INR 3.9 at discharge. Coumadin held on day of discharge. Will have INR check at PCP office 08/06/13.      Procedures:  none  Consultations:  none  Discharge Exam: Filed Vitals:   08/05/13 1247  BP: 118/70  Pulse: 88  Temp: 99 F (37.2 C)  Resp: 19    General: well nourished up in chair NAD Cardiovascular: RRR No MGR No LE edema Respiratory: normal effort Fine crackles particularly in bases no wheeze no rhonchi Abdomen: soft +BS. Non-tender  Discharge Instructions  Discharge Orders   Future Appointments Provider Department Dept Phone   08/06/2013 11:30 AM [facility] Pharmacist [facility] [phone]   08/19/2013 10:00 AM [facility] Pharmacist [facility] [phone]   08/26/2013 9:45 AM Marinus Maw, MD [facility] Office [phone]   09/12/2013 11:00 AM Ileana Ladd, MD [facility] [phone]   10/03/2013 8:30 AM [facility] [phone]   Future Orders Complete By Expires   Diet - low sodium heart healthy  As directed    Discharge instructions  As directed    Comments:     Take medication as directed INR check tomorrow at 11:30am and coumadin dosing.  Keep current scheduled PCP appointment on 08/19/13 for  evaluation of symptoms.   Increase activity slowly  As directed        Medication List         acetaminophen 500 MG tablet  Commonly  known as:  TYLENOL  Take 500 mg by mouth every 6 (six) hours as needed for pain.     amiodarone 200 MG tablet  Commonly known as:  PACERONE  Take 1 tablet (200 mg total) by mouth 2 (two) times daily.     azithromycin 500 MG tablet  Commonly known as:  ZITHROMAX  Take 1 tablet (500 mg total) by mouth daily.     calcium gluconate 500 MG tablet  Take 500-1,000 mg by mouth at bedtime.     cefUROXime 500 MG tablet  Commonly known as:  CEFTIN  Take 1 tablet (500 mg total) by mouth 2 (two) times daily with a meal.     esomeprazole 40 MG capsule  Commonly known as:  NEXIUM  Take 40 mg by mouth every morning.     ezetimibe 10 MG tablet  Commonly known as:  ZETIA  Take 10 mg by mouth every morning.     fish oil-omega-3 fatty acids 1000 MG capsule  Take 1-2 g by mouth 2 (two) times daily. Takes two capsules in the morning and one capsule at bedtime     fluticasone 50 MCG/ACT nasal spray  Commonly known as:  FLONASE  Place 2 sprays into the nose daily.     furosemide 20 MG tablet  Commonly known as:  LASIX  Take 20 mg by mouth daily as needed. swelling     lisinopril 20 MG tablet  Commonly known as:  PRINIVIL,ZESTRIL  Take 20 mg by mouth daily.     metoprolol tartrate 25 MG tablet  Commonly known as:  LOPRESSOR  Take 1 tablet (25 mg total) by mouth 2 (two) times daily.     multivitamin with minerals Tabs tablet  Take 1 tablet by mouth daily.     pravastatin 40 MG tablet  Commonly known as:  PRAVACHOL  Take 40 mg by mouth at bedtime.     traMADol 50 MG tablet  Commonly known as:  ULTRAM  Take 1 tablet (50 mg total) by mouth every 8 (eight) hours as needed for pain.     traZODone 50 MG tablet  Commonly known as:  DESYREL  Take 25 mg by mouth at bedtime.     verapamil 120 MG CR tablet  Commonly known as:  CALAN-SR  Take 1 tablet (120 mg total) by mouth 2 (two) times daily.     vitamin C 500 MG tablet  Commonly known as:  ASCORBIC ACID  Take 500 mg by mouth daily.      Vitamin D 2000 UNITS Caps  Take 2,000 Units by mouth at bedtime.     warfarin 2 MG tablet  Commonly known as:  COUMADIN  Take 1 tablet (2 mg total) by mouth daily.       Allergies  Allergen Reactions  . Codeine Nausea And Vomiting  . Lopid [Gemfibrozil] Other (See Comments)    States that she became hyper after taking this medication  . Naprosyn [Naproxen] Other (See Comments)    Upset stomach    . Percocet [Oxycodone-Acetaminophen] Nausea And Vomiting       Follow-up Information   Follow up with [facility]. Banker)    Solicitor information:   [address] [place] [address] [phone]  Follow up with Rudi Heap, MD On 08/06/2013. (INR check and coumadin dosing.)    Specialty:  Family Medicine   Contact information:   [address] [place] [address] [phone]        The results of significant diagnostics from this hospitalization (including imaging, microbiology, ancillary and laboratory) are listed below for reference.    Significant Diagnostic Studies: Dg Chest 2 View  08/03/2013   CLINICAL DATA:  Fever. Shortness of breath. Right chest and shoulder pain.  EXAM: CHEST  2 VIEW  COMPARISON:  05/09/2013  FINDINGS: Mild cardiomegaly and pulmonary vascular congestion are stable. Dual lead transvenous pacemaker remains in appropriate position.  Increased opacity is seen in the region of the lingula which is most pronounced on the lateral projection, suspicious for pneumonia. No evidence of pleural effusion.  IMPRESSION: Increased lingular opacity, suspicious for pneumonia. Recommend short-term radiographic followup in several weeks to confirm resolution.  Stable cardiomegaly and pulmonary vascular congestion.   Electronically Signed   By: Myles Rosenthal M.D.   On: 08/03/2013 14:40    Microbiology: Recent Results (from the past 240 hour(s))  CULTURE, RESPIRATORY (NON-EXPECTORATED)     Status: None   Collection Time    08/04/13  2:50 AM       Result Value Range Status   Specimen Description SPUTUM   Final   Special Requests NONE   Final   Gram Stain     Final   Value: ABUNDANT WBC PRESENT, PREDOMINANTLY PMN     RARE SQUAMOUS EPITHELIAL CELLS PRESENT     ABUNDANT GRAM POSITIVE COCCI     IN PAIRS IN CHAINS     Performed at [facility]   Culture     Final   Value: Culture reincubated for better growth     Performed at [facility]   Report Status PENDING   Incomplete     Labs: Basic Metabolic Panel:  Recent Labs Lab 08/03/13 1419 08/04/13 0609 08/05/13 0517  NA 131* 135 135  K 3.8 4.1 3.8  CL 95* 100 100  CO2 24 26 25   GLUCOSE 132* 107* 110*  BUN 25* 21 18  CREATININE 1.24* 1.12* 1.01  CALCIUM 9.6 8.7 9.1   Liver Function Tests: No results found for this basename: AST, ALT, ALKPHOS, BILITOT, PROT, ALBUMIN,  in the last 168 hours No results found for this basename: LIPASE, AMYLASE,  in the last 168 hours No results found for this basename: AMMONIA,  in the last 168 hours CBC:  Recent Labs Lab 08/03/13 1419 08/04/13 0609 08/05/13 0517  WBC 21.7* 17.6* 14.4*  NEUTROABS 18.8*  --   --   HGB 13.5 11.8* 11.1*  HCT 39.5 35.3* 33.0*  MCV 91.4 92.2 91.9  PLT 254 256 273   Cardiac Enzymes:  Recent Labs Lab 08/03/13 1419  TROPONINI <0.30   BNP: BNP (last 3 results)  Recent Labs  05/09/13 1330  PROBNP 1249.0*   CBG: No results found for this basename: GLUCAP,  in the last 168 hours     Signed:  Gwenyth Bender  [facility] 08/05/2013, 3:26 PM

## 2013-08-06 ENCOUNTER — Ambulatory Visit (INDEPENDENT_AMBULATORY_CARE_PROVIDER_SITE_OTHER): Payer: Medicare Other | Admitting: Pharmacist

## 2013-08-06 DIAGNOSIS — R791 Abnormal coagulation profile: Secondary | ICD-10-CM

## 2013-08-06 DIAGNOSIS — I4891 Unspecified atrial fibrillation: Secondary | ICD-10-CM

## 2013-08-06 LAB — CBC WITH DIFFERENTIAL
Basophils Absolute: 0 10*3/uL (ref 0.0–0.2)
Basos: 0 %
Eos: 1 %
HCT: 33.8 % — ABNORMAL LOW (ref 34.0–46.6)
Hemoglobin: 11.8 g/dL (ref 11.1–15.9)
Lymphocytes Absolute: 1.1 10*3/uL (ref 0.7–3.1)
Lymphs: 11 %
MCH: 30.4 pg (ref 26.6–33.0)
Monocytes: 11 %
Platelets: 328 10*3/uL (ref 150–379)
RBC: 3.88 x10E6/uL (ref 3.77–5.28)
RDW: 13.5 % (ref 12.3–15.4)
WBC: 10.5 10*3/uL (ref 3.4–10.8)

## 2013-08-06 LAB — CULTURE, RESPIRATORY W GRAM STAIN: Culture: NORMAL

## 2013-08-06 LAB — POCT INR: INR: >8

## 2013-08-06 LAB — CULTURE, RESPIRATORY

## 2013-08-06 LAB — PROTIME-INR: INR: 8.6 (ref 0.8–1.2)

## 2013-08-06 NOTE — Addendum Note (Signed)
Addended by: Lisbeth Ply C on: 08/06/2013 03:04 PM   Modules accepted: Orders

## 2013-08-07 ENCOUNTER — Telehealth: Payer: Self-pay | Admitting: *Deleted

## 2013-08-07 NOTE — Telephone Encounter (Signed)
Pts INR was 8.0 today per [facility]. Verbal Order given by Dr. Christell Constant for Vitamin K 5 mg po today and recheck protime tomorrow. Orders called to [place] at [facility] and med called into [facility]

## 2013-08-08 ENCOUNTER — Telehealth: Payer: Self-pay | Admitting: *Deleted

## 2013-08-08 NOTE — Telephone Encounter (Signed)
INR 4.3 today. I called Tammy and she gave verbal order to continue to hold coumadin until after next protime on Monday. Pt and [facility] notified

## 2013-08-11 ENCOUNTER — Ambulatory Visit (INDEPENDENT_AMBULATORY_CARE_PROVIDER_SITE_OTHER): Payer: Self-pay | Admitting: Pharmacist

## 2013-08-11 ENCOUNTER — Telehealth: Payer: Self-pay | Admitting: *Deleted

## 2013-08-11 DIAGNOSIS — I4891 Unspecified atrial fibrillation: Secondary | ICD-10-CM

## 2013-08-11 LAB — POCT INR: INR: 3

## 2013-08-11 MED ORDER — WARFARIN SODIUM 2 MG PO TABS: 2.0000 mg | ORAL_TABLET | Freq: Every day | ORAL | Status: DC

## 2013-08-11 NOTE — Telephone Encounter (Signed)
Protime 35.8, INR 3.0, call Tonya with new orders [phone]

## 2013-08-11 NOTE — Telephone Encounter (Signed)
See anticoagulation note for warfarin adjustment.  Patient notified of results and warfarin adjustment. Tonya with [facility] also notified and given order to recheck PT/INR on Thursday - 08/13/13.

## 2013-08-14 ENCOUNTER — Ambulatory Visit (INDEPENDENT_AMBULATORY_CARE_PROVIDER_SITE_OTHER): Payer: Self-pay | Admitting: Pharmacist

## 2013-08-14 ENCOUNTER — Other Ambulatory Visit: Payer: Self-pay | Admitting: *Deleted

## 2013-08-14 ENCOUNTER — Telehealth: Payer: Self-pay | Admitting: *Deleted

## 2013-08-14 ENCOUNTER — Telehealth: Payer: Self-pay | Admitting: Internal Medicine

## 2013-08-14 DIAGNOSIS — I4891 Unspecified atrial fibrillation: Secondary | ICD-10-CM

## 2013-08-14 MED ORDER — WARFARIN SODIUM 2 MG PO TABS: 2.0000 mg | ORAL_TABLET | Freq: Every day | ORAL | Status: DC

## 2013-08-14 NOTE — Telephone Encounter (Signed)
Patient instructed to continue current warfarin dose of 1mg  MWF and 2mg  all other days.  [facility] will recheck Monday, November 17th

## 2013-08-14 NOTE — Progress Notes (Signed)
Patient and [name] with [facility] advised of warfarin dosing instructions.  INR recheck order through [facility] on Monday 08/18/13

## 2013-08-14 NOTE — Telephone Encounter (Signed)
refaxed today.

## 2013-08-14 NOTE — Telephone Encounter (Signed)
Protime 27.0  INR 2.2

## 2013-08-14 NOTE — Telephone Encounter (Signed)
New problem    Wanting to know status of clearance form that was faxed 10/31 and 08/13/13. Please advise

## 2013-08-14 NOTE — Telephone Encounter (Signed)
See anticoagulation note for warfarin changes.

## 2013-08-18 ENCOUNTER — Ambulatory Visit (INDEPENDENT_AMBULATORY_CARE_PROVIDER_SITE_OTHER): Payer: Self-pay | Admitting: Pharmacist

## 2013-08-18 DIAGNOSIS — I4891 Unspecified atrial fibrillation: Secondary | ICD-10-CM

## 2013-08-18 LAB — POCT INR: INR: 2.4

## 2013-08-18 NOTE — Progress Notes (Signed)
.  No charge for labs or visit - INR check through [facility] .

## 2013-08-21 ENCOUNTER — Telehealth: Payer: Self-pay | Admitting: *Deleted

## 2013-08-21 ENCOUNTER — Ambulatory Visit (INDEPENDENT_AMBULATORY_CARE_PROVIDER_SITE_OTHER): Payer: Medicare Other | Admitting: Pharmacist

## 2013-08-21 ENCOUNTER — Other Ambulatory Visit: Payer: Self-pay | Admitting: Family Medicine

## 2013-08-21 DIAGNOSIS — I4891 Unspecified atrial fibrillation: Secondary | ICD-10-CM

## 2013-08-21 LAB — POCT INR: INR: 2.2

## 2013-08-21 NOTE — Telephone Encounter (Signed)
Patient already called about warfarin.  Called [name] with [facility].  Will continue current warfarin dose and recheck INR in 2-3 weeks.

## 2013-08-21 NOTE — Progress Notes (Signed)
Patient checked by home health at home with Home INR monitoring.  Billing once per month interupertation fee.  Patient diagnosis - A.fib Procedure code if G0250

## 2013-08-21 NOTE — Telephone Encounter (Signed)
Protime 25.5, INR 2.1 Call [place] at [phone]

## 2013-08-25 ENCOUNTER — Telehealth: Payer: Self-pay | Admitting: Pharmacist

## 2013-08-25 NOTE — Telephone Encounter (Signed)
Spoke with [name] with [facility].  They were confirming that next INR check was December 10th.  Patient's INR was been at goal 11/13, 11/17 and 11/20.  She is seeing Dr [name] tomorrow and per Archie Patten [name]'s warfarin might be changed to another anticoagulation agent. Will check note after appt with Dr [name] and follow up as needed.

## 2013-08-26 ENCOUNTER — Ambulatory Visit (INDEPENDENT_AMBULATORY_CARE_PROVIDER_SITE_OTHER): Payer: Medicare Other | Admitting: Internal Medicine

## 2013-08-26 VITALS — BP 148/78 | HR 88 | Ht 65.0 in | Wt 163.0 lb

## 2013-08-26 DIAGNOSIS — I10 Essential (primary) hypertension: Secondary | ICD-10-CM

## 2013-08-26 DIAGNOSIS — I4891 Unspecified atrial fibrillation: Secondary | ICD-10-CM

## 2013-08-26 DIAGNOSIS — I495 Sick sinus syndrome: Secondary | ICD-10-CM

## 2013-08-26 DIAGNOSIS — Z950 Presence of cardiac pacemaker: Secondary | ICD-10-CM

## 2013-08-26 LAB — MDC_IDC_ENUM_SESS_TYPE_INCLINIC
Battery Remaining Longevity: 116 mo
Battery Voltage: 2.79 V
Brady Statistic AP VP Percent: 37 %
Brady Statistic AS VP Percent: 0 %
Lead Channel Impedance Value: 435 Ohm
Lead Channel Pacing Threshold Amplitude: 1 V
Lead Channel Pacing Threshold Pulse Width: 0.4 ms
Lead Channel Sensing Intrinsic Amplitude: 15.68 mV
Lead Channel Setting Pacing Amplitude: 2 V
Lead Channel Setting Pacing Amplitude: 2.5 V
Lead Channel Setting Pacing Pulse Width: 0.4 ms

## 2013-08-26 MED ORDER — RIVAROXABAN 20 MG PO TABS: 20.0000 mg | ORAL_TABLET | Freq: Every day | ORAL | Status: AC

## 2013-08-26 NOTE — Patient Instructions (Addendum)
Your physician wants you to follow-up in: 6 months with Dr Court Joy will receive a reminder letter in the mail two months in advance. If you don't receive a letter, please call our office to schedule the follow-up appointment.    Remote monitoring is used to monitor your Pacemaker or ICD from home. This monitoring reduces the number of office visits required to check your device to one time per year. It allows [place] to keep an eye on the functioning of your device to ensure it is working properly. You are scheduled for a device check from home on 11/27/13. You may send your transmission at any time that day. If you have a wireless device, the transmission will be sent automatically. After your physician reviews your transmission, you will receive a postcard with your next transmission date.   Your physician has recommended you make the following change in your medication:  1) Stop Coumadin 2) Start Xarelto 20mg  daily

## 2013-08-27 ENCOUNTER — Telehealth: Payer: Self-pay | Admitting: Internal Medicine

## 2013-08-27 ENCOUNTER — Other Ambulatory Visit: Payer: Self-pay | Admitting: Family Medicine

## 2013-08-27 NOTE — Assessment & Plan Note (Signed)
Her blood pressure is mildly elevated. She'll continue her current medications, and asked her to maintain a low-sodium diet.

## 2013-08-27 NOTE — Assessment & Plan Note (Signed)
Her Medtronic dual-chamber pacemaker is working normally. We'll plan to recheck in several months. 
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
## 2013-08-27 NOTE — Telephone Encounter (Signed)
New problem     Pt needs pre-Authorization  On the XARELTO 20 mg, pt has 4 pills right now pt needs it faxed in.  [place]son [id number] # [id number]

## 2013-08-27 NOTE — Assessment & Plan Note (Signed)
The patient is now on 200 mg daily of amiodarone. Pacemaker interrogation demonstrates that she remains out of rhythm approximately 20% of the time. Hopefully as amiodarone is loaded into her, she will have less atrial fibrillation and feel better. With regard to her anticoagulation, I recommended that we stop warfarin, and start Xarelto.

## 2013-08-29 NOTE — Telephone Encounter (Signed)
Last seen 06/13/13, last filled 07/15/13. Rx will print

## 2013-08-31 DIAGNOSIS — I4891 Unspecified atrial fibrillation: Secondary | ICD-10-CM

## 2013-08-31 DIAGNOSIS — J189 Pneumonia, unspecified organism: Secondary | ICD-10-CM

## 2013-08-31 DIAGNOSIS — Z5181 Encounter for therapeutic drug level monitoring: Secondary | ICD-10-CM

## 2013-08-31 DIAGNOSIS — Z950 Presence of cardiac pacemaker: Secondary | ICD-10-CM

## 2013-08-31 DIAGNOSIS — Z7901 Long term (current) use of anticoagulants: Secondary | ICD-10-CM

## 2013-09-01 ENCOUNTER — Encounter: Payer: Self-pay | Admitting: Internal Medicine

## 2013-09-01 NOTE — Telephone Encounter (Signed)
Patient aware.

## 2013-09-01 NOTE — Telephone Encounter (Signed)
Rx ready for pick up. 

## 2013-09-01 NOTE — Telephone Encounter (Signed)
Prior authorization from [employer] rx for XARELTO approved PA# [id number]

## 2013-09-12 ENCOUNTER — Encounter: Payer: Self-pay | Admitting: Family Medicine

## 2013-09-12 ENCOUNTER — Ambulatory Visit (INDEPENDENT_AMBULATORY_CARE_PROVIDER_SITE_OTHER): Payer: Medicare Other | Admitting: Family Medicine

## 2013-09-12 VITALS — BP 117/77 | HR 85 | Temp 99.2°F | Ht 65.0 in | Wt 160.0 lb

## 2013-09-12 DIAGNOSIS — M549 Dorsalgia, unspecified: Secondary | ICD-10-CM

## 2013-09-12 DIAGNOSIS — Z950 Presence of cardiac pacemaker: Secondary | ICD-10-CM

## 2013-09-12 DIAGNOSIS — Z23 Encounter for immunization: Secondary | ICD-10-CM

## 2013-09-12 DIAGNOSIS — I872 Venous insufficiency (chronic) (peripheral): Secondary | ICD-10-CM

## 2013-09-12 DIAGNOSIS — I10 Essential (primary) hypertension: Secondary | ICD-10-CM

## 2013-09-12 DIAGNOSIS — Z7901 Long term (current) use of anticoagulants: Secondary | ICD-10-CM

## 2013-09-12 DIAGNOSIS — I4891 Unspecified atrial fibrillation: Secondary | ICD-10-CM

## 2013-09-12 DIAGNOSIS — I739 Peripheral vascular disease, unspecified: Secondary | ICD-10-CM

## 2013-09-12 DIAGNOSIS — I4892 Unspecified atrial flutter: Secondary | ICD-10-CM

## 2013-09-12 DIAGNOSIS — E785 Hyperlipidemia, unspecified: Secondary | ICD-10-CM

## 2013-09-12 DIAGNOSIS — I83893 Varicose veins of bilateral lower extremities with other complications: Secondary | ICD-10-CM

## 2013-09-12 LAB — POCT CBC
Granulocyte percent: 58.7 %G (ref 37–80)
HCT, POC: 41.1 % (ref 37.7–47.9)
Hemoglobin: 13.2 g/dL (ref 12.2–16.2)
Lymph, poc: 2.6 (ref 0.6–3.4)
MCH, POC: 30.3 pg (ref 27–31.2)
MCHC: 32.2 g/dL (ref 31.8–35.4)
MCV: 94.3 fL (ref 80–97)
MPV: 8.3 fL (ref 0–99.8)
POC Granulocyte: 4.2 (ref 2–6.9)
POC LYMPH PERCENT: 36.7 %L (ref 10–50)
Platelet Count, POC: 291 10*3/uL (ref 142–424)
RBC: 4.4 M/uL (ref 4.04–5.48)
RDW, POC: 14.7 %
WBC: 7.1 10*3/uL (ref 4.6–10.2)

## 2013-09-12 MED ORDER — ALBUTEROL SULFATE HFA 108 (90 BASE) MCG/ACT IN AERS: 2.0000 | INHALATION_SPRAY | Freq: Four times a day (QID) | RESPIRATORY_TRACT | Status: AC | PRN

## 2013-09-12 NOTE — Patient Instructions (Signed)
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
## 2013-09-13 LAB — CMP14+EGFR
ALT: 14 IU/L (ref 0–32)
AST: 20 IU/L (ref 0–40)
Albumin/Globulin Ratio: 1.7 (ref 1.1–2.5)
Albumin: 4.1 g/dL (ref 3.5–4.7)
Alkaline Phosphatase: 84 IU/L (ref 39–117)
BUN/Creatinine Ratio: 13 (ref 11–26)
BUN: 14 mg/dL (ref 8–27)
CO2: 25 mmol/L (ref 18–29)
Calcium: 9.7 mg/dL (ref 8.6–10.2)
Chloride: 100 mmol/L (ref 97–108)
Creatinine, Ser: 1.07 mg/dL — ABNORMAL HIGH (ref 0.57–1.00)
GFR calc Af Amer: 57 mL/min/{1.73_m2} — ABNORMAL LOW (ref >59)
GFR calc non Af Amer: 49 mL/min/{1.73_m2} — ABNORMAL LOW (ref >59)
Globulin, Total: 2.4 g/dL (ref 1.5–4.5)
Glucose: 80 mg/dL (ref 65–99)
Potassium: 4.7 mmol/L (ref 3.5–5.2)
Sodium: 142 mmol/L (ref 134–144)
Total Bilirubin: 0.5 mg/dL (ref 0.0–1.2)
Total Protein: 6.5 g/dL (ref 6.0–8.5)

## 2013-09-13 NOTE — Progress Notes (Signed)
Quick Note:  Call patient. Labs normal. No change in plan. ______ 

## 2013-09-22 ENCOUNTER — Ambulatory Visit (INDEPENDENT_AMBULATORY_CARE_PROVIDER_SITE_OTHER): Payer: Medicare Other | Admitting: Pharmacist

## 2013-09-22 DIAGNOSIS — I4891 Unspecified atrial fibrillation: Secondary | ICD-10-CM

## 2013-09-22 NOTE — Progress Notes (Signed)
Encounter created to discontinue follow up through anticoagulation clinic.  Patient was started on xarelto 08/26/13 and warfarin was discontinued.   Henrene Pastor, PharmD, CPP

## 2013-09-29 ENCOUNTER — Other Ambulatory Visit: Payer: Self-pay | Admitting: Family Medicine

## 2013-10-01 NOTE — Telephone Encounter (Signed)
Patient last seen in office on 09-12-13. Rx last filled on 08-27-13. Please advise. If approved please print and route to Pool A so nurse can call patient to pick up

## 2013-10-03 ENCOUNTER — Ambulatory Visit (INDEPENDENT_AMBULATORY_CARE_PROVIDER_SITE_OTHER): Payer: Medicare Other | Admitting: Pharmacist

## 2013-10-03 ENCOUNTER — Encounter: Payer: Medicare Other | Admitting: Pharmacist

## 2013-10-03 ENCOUNTER — Encounter: Payer: Self-pay | Admitting: Pharmacist

## 2013-10-03 ENCOUNTER — Other Ambulatory Visit: Payer: Self-pay | Admitting: Family Medicine

## 2013-10-03 VITALS — BP 122/78 | HR 76 | Ht 65.0 in | Wt 161.0 lb

## 2013-10-03 DIAGNOSIS — Z0000 Encounter for general adult medical examination without abnormal findings: Secondary | ICD-10-CM

## 2013-10-03 NOTE — Telephone Encounter (Signed)
Rx ready for pick up. 

## 2013-10-03 NOTE — Telephone Encounter (Signed)
Pt awarre to pick up rx for tramadol

## 2013-10-03 NOTE — Patient Instructions (Addendum)
Health Maintenance Summary    ZOSTAVAX Postponed 04/30/2014 Postponed until 04/30/2014. Insurance / [employer]    TETANUS/TDAP Postponed 04/30/2014 Postponed until 04/30/2014. Insurance / Financial    INFLUENZA VACCINE Next Due 05/02/2014      MAMMOGRAM Next Due 09/08/2014      Preventive Care for Adults, Female A healthy lifestyle and preventive care can promote health and wellness. Preventive health guidelines for women include the following key practices.  A routine yearly physical is a good way to check with your caregiver about your health and preventive screening. It is a chance to share any concerns and updates on your health, and to receive a thorough exam.  Visit your dentist for a routine exam and preventive care every 6 months. Brush your teeth twice a day and floss once a day. Good oral hygiene prevents tooth decay and gum disease.  The frequency of eye exams is based on your age, health, family medical history, use of contact lenses, and other factors. Follow your caregiver's recommendations for frequency of eye exams.  Eat a healthy diet. Foods like vegetables, fruits, whole grains, low-fat dairy products, and lean protein foods contain the nutrients you need without too many calories. Decrease your intake of foods high in solid fats, added sugars, and salt. Eat the right amount of calories for you.Get information about a proper diet from your caregiver, if necessary.  Regular physical exercise is one of the most important things you can do for your health. Most adults should get at least 150 minutes of moderate-intensity exercise (any activity that increases your heart rate and causes you to sweat) each week. In addition, most adults need muscle-strengthening exercises on 2 or more days a week.  Maintain a healthy weight. The body mass index (BMI) is a screening tool to identify possible weight problems. It provides an estimate of body fat based on height and weight. Your caregiver can  help determine your BMI, and can help you achieve or maintain a healthy weight.For adults 20 years and older:  A BMI below 18.5 is considered underweight.  A BMI of 18.5 to 24.9 is normal.  A BMI of 25 to 29.9 is considered overweight.  A BMI of 30 and above is considered obese.  Maintain normal blood lipids and cholesterol levels by exercising and minimizing your intake of saturated fat. Eat a balanced diet with plenty of fruit and vegetables. Blood tests for lipids and cholesterol should begin at age 50 and be repeated every 5 years. If your lipid or cholesterol levels are high, you are over 50, or you are at high risk for heart disease, you may need your cholesterol levels checked more frequently.Ongoing high lipid and cholesterol levels should be treated with medicines if diet and exercise are not effective.  If you smoke, find out from your caregiver how to quit. If you do not use tobacco, do not start.  Lung cancer screening is recommended for adults aged 63 80 years who are at high risk for developing lung cancer because of a history of smoking. Yearly low-dose computed tomography (CT) is recommended for people who have at least a 30-pack-year history of smoking and are a current smoker or have quit within the past 15 years. A pack year of smoking is smoking an average of 1 pack of cigarettes a day for 1 year (for example: 1 pack a day for 30 years or 2 packs a day for 15 years). Yearly screening should continue until the smoker has stopped smoking  for at least 15 years. Yearly screening should also be stopped for people who develop a health problem that would prevent them from having lung cancer treatment.  High blood pressure causes heart disease and increases the risk of stroke. Your blood pressure should be checked at least every 1 to 2 years. Ongoing high blood pressure should be treated with medicines if weight loss and exercise are not effective.  If you are 61 to 78 years old, ask  your caregiver if you should take aspirin to prevent strokes.  Diabetes screening involves taking a blood sample to check your fasting blood sugar level. This should be done once every 3 years, after age 45, if you are within normal weight and without risk factors for diabetes. Testing should be considered at a younger age or be carried out more frequently if you are overweight and have at least 1 risk factor for diabetes.    Blood pressure check at every visit/ Every 1 to 2 years.  Lipid and cholesterol check every 3 to 6 months  Lung cancer screening. / Every year if you are aged 79 80 years and have a 30-pack-year history of smoking and currently smoke or have quit within the past 15 years. Yearly screening is stopped once you have quit smoking for at least 15 years or develop a health problem that would prevent you from having lung cancer treatment.  Clinical breast exam - Every year after age 45.  Mammogram -  Every year beginning at age 23 and continuing for as long as you are in good health. Up to date - next due 09/2013  Pap test./ Every 3 years starting at age 33 through age 4 or 62 with a 3 consecutive normal Pap tests. Testing can be stopped between 65 and 70 with 3 consecutive normal Pap tests and no abnormal Pap or HPV tests in the past 10 years.  Flexible sigmoidoscopy or colonoscopy -  Every 5 years for a flexible sigmoidoscopy or every 10 years for a colonoscopy beginning at age 43 and continuing until age 28.  Checking on last time colonoscopy was performed and will recommend when next is due once we know this.   Osteoporosis screening - checking when this was last done.  Recommended every 2 years.  Skin self-exam. / Monthly.  Influenza vaccine. / Every year.  Tetanus, diphtheria, and acellular pertussis (Tdap/Td) vaccine - 1 dose of Td every 10 years.  Zoster vaccine.- 1 dose for adults aged 58 years or older. Recommended once  Pneumococcal polysaccharide (PPSV23)  vaccine - completed

## 2013-10-03 NOTE — Progress Notes (Signed)
Subjective:    [name] is a 80 y.o. female who presents for Medicare Initial Annual Wellness Visist  Preventive Screening-Counseling & Management  Tobacco History  Smoking status  . Former Smoker -- 3 years  . Types: Cigarettes  . Quit date: 02/17/1996  Smokeless tobacco  . Never Used     Current Problems (verified) Patient Active Problem List   Diagnosis Date Noted  . Need for prophylactic vaccination against Streptococcus pneumoniae (pneumococcus) 09/12/2013  . Community acquired pneumonia 08/03/2013  . Sepsis 08/03/2013  . Acute respiratory failure with hypoxia 08/03/2013  . Sinusitis nasal 06/13/2013  . Pain in limb 05/20/2013  . Peripheral vascular disease, unspecified 05/20/2013  . Atrial flutter with rapid ventricular response 05/09/2013  . Chronic anticoagulation 05/09/2013  . HLD (hyperlipidemia) 03/11/2013  . Varicose veins of lower extremities with other complications 86/57/8469  . Venous insufficiency 05/20/2012  . Nevus, non-neoplastic 05/20/2012  . HYPERTENSION, UNSPECIFIED 02/09/2009  . Atrial fibrillation 02/09/2009  . ATRIAL FLUTTER, PAROXYSMAL 02/09/2009  . BRADYCARDIA-TACHYCARDIA SYNDROME 02/09/2009  . BACK PAIN, CHRONIC 02/09/2009  . PPM-Medtronic 02/09/2009    Medications Prior to Visit Current Outpatient Prescriptions on File Prior to Visit  Medication Sig Dispense Refill  . albuterol (PROVENTIL HFA;VENTOLIN HFA) 108 (90 BASE) MCG/ACT inhaler Inhale 2 puffs into the lungs every 6 (six) hours as needed for wheezing or shortness of breath.  1 Inhaler  2  . amiodarone (PACERONE) 100 MG tablet Take 100 mg by mouth 2 (two) times daily.      . calcium gluconate 500 MG tablet Take 500-1,000 mg by mouth at bedtime.      . Cholecalciferol (VITAMIN D) 2000 UNITS CAPS Take 2,000 Units by mouth at bedtime.      Marland Kitchen esomeprazole (NEXIUM) 40 MG capsule Take 40 mg by mouth every morning.      . ezetimibe (ZETIA) 10 MG tablet Take 10 mg by mouth every  morning.      . fish oil-omega-3 fatty acids 1000 MG capsule Take 1-2 g by mouth 2 (two) times daily. Takes two capsules in the morning and two capsules at bedtime      . fluticasone (FLONASE) 50 MCG/ACT nasal spray Place 2 sprays into the nose daily.  16 g  3  . furosemide (LASIX) 20 MG tablet Take 20 mg by mouth daily as needed. swelling      . lisinopril (PRINIVIL,ZESTRIL) 20 MG tablet Take 20 mg by mouth daily.      . metoprolol tartrate (LOPRESSOR) 25 MG tablet Take 1 tablet (25 mg total) by mouth 2 (two) times daily.  60 tablet  6  . Multiple Vitamin (MULTIVITAMIN WITH MINERALS) TABS tablet Take 1 tablet by mouth daily.      . pravastatin (PRAVACHOL) 40 MG tablet Take 40 mg by mouth at bedtime.      . Rivaroxaban (XARELTO) 20 MG TABS tablet Take 1 tablet (20 mg total) by mouth daily with supper.  30 tablet  11  . traMADol (ULTRAM) 50 MG tablet TAKE  (1)  TABLET  EVERY EIGHT HOURS AS NEEDED.  90 tablet  0  . verapamil (CALAN-SR) 120 MG CR tablet Take 1 tablet (120 mg total) by mouth 2 (two) times daily.  60 tablet  11  . vitamin C (ASCORBIC ACID) 500 MG tablet Take 500 mg by mouth daily.      Marland Kitchen MELATONIN ER PO Take by mouth.       No current facility-administered medications on file prior  to visit.    Current Medications (verified) Current Outpatient Prescriptions  Medication Sig Dispense Refill  . albuterol (PROVENTIL HFA;VENTOLIN HFA) 108 (90 BASE) MCG/ACT inhaler Inhale 2 puffs into the lungs every 6 (six) hours as needed for wheezing or shortness of breath.  1 Inhaler  2  . amiodarone (PACERONE) 100 MG tablet Take 100 mg by mouth 2 (two) times daily.      . calcium gluconate 500 MG tablet Take 500-1,000 mg by mouth at bedtime.      . Cholecalciferol (VITAMIN D) 2000 UNITS CAPS Take 2,000 Units by mouth at bedtime.      Marland Kitchen esomeprazole (NEXIUM) 40 MG capsule Take 40 mg by mouth every morning.      . ezetimibe (ZETIA) 10 MG tablet Take 10 mg by mouth every morning.      . fish  oil-omega-3 fatty acids 1000 MG capsule Take 1-2 g by mouth 2 (two) times daily. Takes two capsules in the morning and two capsules at bedtime      . fluticasone (FLONASE) 50 MCG/ACT nasal spray Place 2 sprays into the nose daily.  16 g  3  . furosemide (LASIX) 20 MG tablet Take 20 mg by mouth daily as needed. swelling      . lisinopril (PRINIVIL,ZESTRIL) 20 MG tablet Take 20 mg by mouth daily.      . metoprolol tartrate (LOPRESSOR) 25 MG tablet Take 1 tablet (25 mg total) by mouth 2 (two) times daily.  60 tablet  6  . Multiple Vitamin (MULTIVITAMIN WITH MINERALS) TABS tablet Take 1 tablet by mouth daily.      . pravastatin (PRAVACHOL) 40 MG tablet Take 40 mg by mouth at bedtime.      . Rivaroxaban (XARELTO) 20 MG TABS tablet Take 1 tablet (20 mg total) by mouth daily with supper.  30 tablet  11  . traMADol (ULTRAM) 50 MG tablet TAKE  (1)  TABLET  EVERY EIGHT HOURS AS NEEDED.  90 tablet  0  . traZODone (DESYREL) 25 mg TABS tablet Take 12.5 mg by mouth at bedtime.      . verapamil (CALAN-SR) 120 MG CR tablet Take 1 tablet (120 mg total) by mouth 2 (two) times daily.  60 tablet  11  . vitamin C (ASCORBIC ACID) 500 MG tablet Take 500 mg by mouth daily.      Marland Kitchen MELATONIN ER PO Take by mouth.       No current facility-administered medications for this visit.     Allergies (verified) Codeine; Lopid; Naprosyn; and Percocet   PAST HISTORY  Family History Family History  Problem Relation Age of Onset  . Heart disease Mother   . Stroke Mother   . Hypertension Father   . Cancer Sister     lung  . Cancer Brother     lung and colon CA  . Heart disease Sister   . Diabetes Sister   . Heart disease Brother     CHF  . COPD Brother   . Arthritis Brother     Social History History  Substance Use Topics  . Smoking status: Former Smoker -- 3 years    Types: Cigarettes    Quit date: 02/17/1996  . Smokeless tobacco: Never Used  . Alcohol Use: No     Are there smokers in your home (other  than you)? No  Risk Factors Current exercise habits: Exercise is limited by orthopedic condition(s): herniated and protruding disc, neuropathy.  Dietary issues discussed:  Continues  to follow low fat diet - recently lost 6# over the last 6 weeks.   Cardiac risk factors: advanced age (older than 4 for men, 20 for women), dyslipidemia, family history of premature cardiovascular disease and hypertension.  Depression Screen (Note: if answer to either of the following is "Yes", a more complete depression screening is indicated)   Over the past 2 weeks, have you felt down, depressed or hopeless? No  Over the past 2 weeks, have you felt little interest or pleasure in doing things? No  Have you lost interest or pleasure in daily life? No  Do you often feel hopeless? No  Do you cry easily over simple problems? No  Activities of Daily Living In your present state of health, do you have any difficulty performing the following activities?:  Driving? No Managing money?  No Feeding yourself? No Getting from bed to chair? No Climbing a flight of stairs? Yes - doesn't go up long flights of stair (max in her home is 3 steps) Preparing food and eating?: No Bathing or showering? No Getting dressed: No Getting to the toilet? No Using the toilet:No Moving around from place to place: No In the past year have you fallen or had a near fall?:Yes - fell twice this summer   Are you sexually active?  No  Do you have more than one partner?  No  Hearing Difficulties: No Do you often ask people to speak up or repeat themselves? No Do you experience ringing or noises in your ears? No Do you have difficulty understanding soft or whispered voices? No   Do you feel that you have a problem with memory? No  Do you often misplace items? No  Do you feel safe at home?  Yes  Cognitive Testing  Alert? Yes  Normal Appearance?Yes  Oriented to person? Yes  Place? Yes   Time? Yes  Recall of three objects?   Yes  Can perform simple calculations? Yes  Displays appropriate judgment?Yes  Can read the correct time from a watch face?Yes   Advanced Directives have been discussed with the patient? Yes  [place] - is husband and second is niece - Lorre Munroe  List the Names of Other Physician/Practitioners you currently use: 1.  Dr Joni Fears - ortho 2. Dr Crissie Sickles - cardio 3.  Dr Tinnie Gens - vein specialist 4. Dr Ernesto Rutherford - sinus surgery 5. Dr Cruz Condon - ophthmologist    Immunization History  Administered Date(s) Administered  . Influenza,inj,Quad PF,36+ Mos 07/08/2013  . Pneumococcal Conjugate-13 09/12/2013    Screening Tests Health Maintenance  Topic Date Due  . Zostavax  04/30/2014  . Tetanus/tdap  04/30/2014  . Influenza Vaccine  05/02/2014  . Mammogram  09/08/2014  . Pneumococcal Polysaccharide Vaccine Age 42 And Over  Completed   Patient reports recent glaucoma screening with Dr Gershon Crane but she cannot remember exact date. She also thinks her last DEXA was performed in 2013 in [place] to verify when last colonoscopy was performed.   All answers were reviewed with the patient and necessary referrals were made:  Cherre Robins, [facility]   10/03/2013   History reviewed: allergies, current medications, past family history, past medical history, past social history, past surgical history and problem list    Objective:    Body mass index is 26.79 kg/(m^2). BP 122/78  Pulse 76  Ht 5\' 5"  (1.651 m)  Wt 161 lb (73.029 kg)  BMI 26.79 kg/m2     Assessment:  Medicare Initial Annual Wellness Visit   Plan:     During the course of the visit the patient was educated and counseled about appropriate screening and preventive services including:    Pneumococcal vaccine   Influenza vaccine  Td vaccine  Screening mammography  Screening Pap smear and pelvic exam   Bone densitometry screening  Colorectal cancer screening  Diabetes  screening  Glaucoma screening  Nutrition counseling   Advanced directives: information discussed  Contacting office to verify and get reports for last colonoscopy, eye exam / glaucome and DEXA.   Patient Instructions (the written plan) was given to the patient.  Medicare Attestation I have personally reviewed: The patient's medical and social history Their use of alcohol, tobacco or illicit drugs Their current medications and supplements The patient's functional ability including ADLs,fall risks, home safety risks, cognitive, and hearing and visual impairment Diet and physical activities Evidence for depression or mood disorders  The patient's weight, height, BMI, and BP have been recorded in the chart.  I have made referrals, counseling, and provided education to the patient based on review of the above and I have provided the patient with a written personalized care plan for preventive services.     Cherre Robins, [facility]   10/03/2013

## 2013-11-04 ENCOUNTER — Other Ambulatory Visit: Payer: Self-pay | Admitting: Family Medicine

## 2013-11-05 NOTE — Telephone Encounter (Signed)
Patient last seen in office on 10-03-13 with [name]. Rx last filled on 09-29-13 for #90. Please advise. If approved please print and route to Pool A so nurse can call patient to pick up

## 2013-11-06 ENCOUNTER — Telehealth: Payer: Self-pay | Admitting: Internal Medicine

## 2013-11-06 NOTE — Telephone Encounter (Signed)
Called [employer] and got approval for xarelto back dated to yesterday for 1 year, will call patient and notify her.

## 2013-11-06 NOTE — Telephone Encounter (Signed)
Pt aware to pick up rx at front desk for tramadol

## 2013-11-06 NOTE — Telephone Encounter (Signed)
Patient paid $ 299.34 and is so upset.  She was previously paying $40.00 for the med.  I let her know Maudie Mercury would check into this and call her

## 2013-11-06 NOTE — Telephone Encounter (Signed)
Rx ready for pick up. 

## 2013-11-06 NOTE — Telephone Encounter (Signed)
New message   Need come off xarelto  Due to cost was $ 299.34 for 30 pills on yesterday.

## 2013-11-20 ENCOUNTER — Other Ambulatory Visit: Payer: Self-pay | Admitting: Family Medicine

## 2013-11-27 ENCOUNTER — Ambulatory Visit (INDEPENDENT_AMBULATORY_CARE_PROVIDER_SITE_OTHER): Payer: Medicare Other | Admitting: *Deleted

## 2013-11-27 DIAGNOSIS — I4892 Unspecified atrial flutter: Secondary | ICD-10-CM

## 2013-11-27 DIAGNOSIS — I495 Sick sinus syndrome: Secondary | ICD-10-CM

## 2013-11-27 DIAGNOSIS — I4891 Unspecified atrial fibrillation: Secondary | ICD-10-CM

## 2013-11-29 ENCOUNTER — Encounter: Payer: Self-pay | Admitting: Internal Medicine

## 2013-12-01 ENCOUNTER — Ambulatory Visit (HOSPITAL_COMMUNITY)
Admission: RE | Admit: 2013-12-01 | Discharge: 2013-12-01 | Disposition: A | Discharge status: Home / Self Care | Payer: Medicare Other | Source: Ambulatory Visit | Attending: Family Medicine | Admitting: Family Medicine

## 2013-12-01 ENCOUNTER — Ambulatory Visit (INDEPENDENT_AMBULATORY_CARE_PROVIDER_SITE_OTHER): Payer: Medicare Other

## 2013-12-01 ENCOUNTER — Ambulatory Visit (INDEPENDENT_AMBULATORY_CARE_PROVIDER_SITE_OTHER): Payer: Medicare Other | Admitting: Family Medicine

## 2013-12-01 ENCOUNTER — Telehealth: Payer: Self-pay | Admitting: Family Medicine

## 2013-12-01 ENCOUNTER — Encounter: Payer: Self-pay | Admitting: Family Medicine

## 2013-12-01 VITALS — BP 149/91 | HR 96 | Temp 98.6°F | Ht 65.0 in | Wt 166.0 lb

## 2013-12-01 DIAGNOSIS — R0989 Other specified symptoms and signs involving the circulatory and respiratory systems: Secondary | ICD-10-CM

## 2013-12-01 DIAGNOSIS — M79609 Pain in unspecified limb: Secondary | ICD-10-CM

## 2013-12-01 DIAGNOSIS — I2510 Atherosclerotic heart disease of native coronary artery without angina pectoris: Secondary | ICD-10-CM | POA: Insufficient documentation

## 2013-12-01 DIAGNOSIS — R9389 Abnormal findings on diagnostic imaging of other specified body structures: Secondary | ICD-10-CM

## 2013-12-01 DIAGNOSIS — R0602 Shortness of breath: Secondary | ICD-10-CM | POA: Insufficient documentation

## 2013-12-01 DIAGNOSIS — R0609 Other forms of dyspnea: Secondary | ICD-10-CM

## 2013-12-01 DIAGNOSIS — R0600 Dyspnea, unspecified: Secondary | ICD-10-CM

## 2013-12-01 DIAGNOSIS — M79606 Pain in leg, unspecified: Secondary | ICD-10-CM

## 2013-12-01 DIAGNOSIS — R222 Localized swelling, mass and lump, trunk: Secondary | ICD-10-CM | POA: Insufficient documentation

## 2013-12-01 DIAGNOSIS — I700 Atherosclerosis of aorta: Secondary | ICD-10-CM | POA: Insufficient documentation

## 2013-12-01 DIAGNOSIS — R609 Edema, unspecified: Secondary | ICD-10-CM | POA: Insufficient documentation

## 2013-12-01 DIAGNOSIS — M7989 Other specified soft tissue disorders: Secondary | ICD-10-CM

## 2013-12-01 DIAGNOSIS — R918 Other nonspecific abnormal finding of lung field: Secondary | ICD-10-CM

## 2013-12-01 LAB — MDC_IDC_ENUM_SESS_TYPE_REMOTE
Battery Remaining Longevity: 79 mo
Brady Statistic AP VP Percent: 96 %
Brady Statistic AS VS Percent: 2 %
Date Time Interrogation Session: 20150226184721
Lead Channel Impedance Value: 450 Ohm
Lead Channel Pacing Threshold Amplitude: 0.75 V
Lead Channel Pacing Threshold Pulse Width: 0.4 ms
Lead Channel Pacing Threshold Pulse Width: 0.4 ms
Lead Channel Setting Pacing Amplitude: 2.5 V
Lead Channel Setting Sensing Sensitivity: 4 mV
MDC IDC MSMT BATTERY IMPEDANCE: 177 Ohm
MDC IDC MSMT BATTERY VOLTAGE: 2.79 V
MDC IDC MSMT LEADCHNL RA IMPEDANCE VALUE: 437 Ohm
MDC IDC MSMT LEADCHNL RA PACING THRESHOLD AMPLITUDE: 0.875 V
MDC IDC SET LEADCHNL RA PACING AMPLITUDE: 3 V
MDC IDC SET LEADCHNL RV PACING PULSEWIDTH: 0.4 ms
MDC IDC STAT BRADY AP VS PERCENT: 1 %
MDC IDC STAT BRADY AS VP PERCENT: 1 %

## 2013-12-01 LAB — POCT CBC
Granulocyte percent: 70.6 %G (ref 37–80)
HCT, POC: 36.7 % — AB (ref 37.7–47.9)
Hemoglobin: 10.9 g/dL — AB (ref 12.2–16.2)
LYMPH, POC: 1.7 (ref 0.6–3.4)
MCH: 27.1 pg (ref 27–31.2)
MCHC: 29.7 g/dL — AB (ref 31.8–35.4)
MCV: 91.3 fL (ref 80–97)
MPV: 7.7 fL (ref 0–99.8)
PLATELET COUNT, POC: 303 10*3/uL (ref 142–424)
POC Granulocyte: 4.8 (ref 2–6.9)
POC LYMPH PERCENT: 25.3 %L (ref 10–50)
RBC: 4 M/uL — AB (ref 4.04–5.48)
RDW, POC: 15 %
WBC: 6.8 10*3/uL (ref 4.6–10.2)

## 2013-12-01 MED ORDER — DOXYCYCLINE HYCLATE 100 MG PO CAPS: 100.0000 mg | ORAL_CAPSULE | Freq: Two times a day (BID) | ORAL | Status: DC

## 2013-12-01 NOTE — Progress Notes (Signed)
Subjective:    Patient ID: [name], female    DOB: [birth year], 80 y.o.   MRN: [id number]  HPI Pt presents today with chief complaint of bilateral LE swelling and pain  Sxs have been present over last 4-5 days.  Baseline h/o afib s/p pacemaker. On xarelto. No CHF on recent ECHO.  Also noted to have been admitted 07/2013 for sepsis 2/2 PNA Has had progressively LE swelling and pain.  Has prn lasix at home per cards [facility]) Has used 2 pills of low dose lasix over the past 2 days with minimal improvement per pt.  Denies any high salt intake of NSAID use.  Does have some popliteal pain bilaterally. No missed doses of xarelto.  No fevers or chills.  Has hx/o cellulitis of LEs in the past.  Sxs do not feel similar to previous cellulitis.      Review of Systems  All other systems reviewed and are negative.       Objective:   Physical Exam  Constitutional: She appears well-developed and well-nourished.  HENT:  Head: Normocephalic and atraumatic.  Right Ear: External ear normal.  Left Ear: External ear normal.  Eyes: Conjunctivae are normal. Pupils are equal, round, and reactive to light.  Neck: Normal range of motion. Neck supple.  Cardiovascular: Normal rate and regular rhythm.   Pulmonary/Chest: Effort normal. She has no wheezes. She has no rales.  Abdominal: Soft.  Musculoskeletal: Normal range of motion.  Neurological: She is alert.  Skin:  2-3+ pitting edema in LEs bilaterally  Mild post knee TTP bilaterally  Neurovascularly intact distally         [facility] reading (PRIMARY) by  Dr. Ernestina Patches  Preliminary CXR read w/ ? Recurrence of lingular PNA. Cardiac silohuette WNL.  Dg Chest 2 View  12/01/2013   CLINICAL DATA:  Dyspnea.  EXAM: CHEST  2 VIEW  COMPARISON:  Chest x-ray 08/03/2013.  FINDINGS: Although there is overall slightly improved aeration throughout the lingula, there is a persistent left upper lobe opacity that is partially obscured by the overlying  left-sided pacemaker device. Lungs otherwise appear clear. Mild elevation of the right hemidiaphragm. No definite pleural effusions. Pulmonary vasculature appears mildly engorged, without frank pulmonary edema. Mild cardiomegaly. The patient is rotated to the right on today's exam, resulting in distortion of the mediastinal contours and reduced diagnostic sensitivity and specificity for mediastinal pathology. Atherosclerosis in the thoracic aorta. Left-sided pacemaker device in position with lead tips projecting over the expected location of the right atrium and right ventricular apex. Orthopedic fixation hardware in the lumbar spine.  IMPRESSION: 1. Persistent opacity in the left upper lobe concerning for residual pneumonia. Given the persistence of this finding compared to prior examination from November 2014, further evaluation with contrast enhanced chest CT is strongly recommended at this time to exclude the possibility of a centrally obstructing lesion such as a neoplasm. 2. Cardiomegaly with pulmonary venous congestion, but no frank pulmonary edema at this time. 3. Atherosclerosis. These results will be called to the ordering clinician or representative by the Radiologist Assistant, and communication documented in the PACS Dashboard.   Electronically Signed   By: Vinnie Langton M.D.   On: 12/01/2013 12:53   Lab Results  Component Value Date   WBC 6.8 12/01/2013   HGB 10.9* 12/01/2013   HCT 36.7* 12/01/2013   MCV 91.3 12/01/2013   PLT 328 08/06/2013  Assessment & Plan:  Pain and swelling of lower extremity - Plan: POCT CBC, Pro b natriuretic peptide, Comprehensive metabolic panel, [place] Extrem Low Bilat Comp  Dyspnea - Plan: DG Chest 2 View, CT Chest Wo Contrast, doxycycline (VIBRAMYCIN) 100 MG capsule  Abnormal chest x-ray  Broad DDx for sxs including volume overoad, PNA recurrence, cellulitis, DVT.  WBC WNL.  Clinically mildly volume overloaded on exam. No overt signs  of cellulitis on exam.  Start on lasix 40mg  daily  CXR indeterminant for PNA. Outside of HCAP window.  Will place on doxy for lower resp and cellulitis coverage. Follow up Chest CT per radiology recs. (w/o contrast in setting of hx/o AKI- CMET pending) Also send for LE u/s to r/o DVT. Wells score or around 1. Already on xarelto.  Overall reassuring exam today apart from LE swelling.  No resp distress. Satting well on RA. Hemodynamically stable.  Plan for follow up early tomorrow.  Discussed systemic, infectious and cardioresp red flags at length with pt.  Follow up in am.

## 2013-12-01 NOTE — Telephone Encounter (Signed)
appt tomorrow with bill oxford

## 2013-12-01 NOTE — Addendum Note (Signed)
Addended by: Ilean China on: 12/01/2013 02:45 PM   Modules accepted: Orders

## 2013-12-02 ENCOUNTER — Encounter: Payer: Self-pay | Admitting: Family Medicine

## 2013-12-02 ENCOUNTER — Ambulatory Visit (INDEPENDENT_AMBULATORY_CARE_PROVIDER_SITE_OTHER): Payer: Medicare Other | Admitting: Family Medicine

## 2013-12-02 VITALS — BP 144/81 | HR 88 | Temp 98.0°F | Ht 65.0 in | Wt 163.2 lb

## 2013-12-02 DIAGNOSIS — R918 Other nonspecific abnormal finding of lung field: Secondary | ICD-10-CM

## 2013-12-02 DIAGNOSIS — R222 Localized swelling, mass and lump, trunk: Secondary | ICD-10-CM

## 2013-12-02 LAB — COMPREHENSIVE METABOLIC PANEL
ALK PHOS: 99 IU/L (ref 39–117)
ALT: 29 IU/L (ref 0–32)
AST: 26 IU/L (ref 0–40)
Albumin/Globulin Ratio: 1.8 (ref 1.1–2.5)
Albumin: 4 g/dL (ref 3.5–4.7)
BUN/Creatinine Ratio: 12 (ref 11–26)
BUN: 12 mg/dL (ref 8–27)
CALCIUM: 9.5 mg/dL (ref 8.7–10.3)
CHLORIDE: 99 mmol/L (ref 97–108)
CO2: 27 mmol/L (ref 18–29)
Creatinine, Ser: 1.04 mg/dL — ABNORMAL HIGH (ref 0.57–1.00)
GFR calc Af Amer: 59 mL/min/{1.73_m2} — ABNORMAL LOW (ref >59)
GFR calc non Af Amer: 51 mL/min/{1.73_m2} — ABNORMAL LOW (ref >59)
Globulin, Total: 2.2 g/dL (ref 1.5–4.5)
Glucose: 96 mg/dL (ref 65–99)
Potassium: 4 mmol/L (ref 3.5–5.2)
SODIUM: 141 mmol/L (ref 134–144)
Total Bilirubin: 0.4 mg/dL (ref 0.0–1.2)
Total Protein: 6.2 g/dL (ref 6.0–8.5)

## 2013-12-02 LAB — PRO B NATRIURETIC PEPTIDE: NT-PRO BNP: 634 pg/mL — AB (ref 0–449)

## 2013-12-02 NOTE — Progress Notes (Signed)
   Subjective:    Patient ID: [name], female    DOB: 1931/06/05, 80 y.o.   MRN: [id number]  HPI This 80 y.o. female presents for evaluation of follow up from visit for edema and swelling and had  CXR showing mass in left upper lung field.  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   Review of Systems    No chest pain, SOB, HA, dizziness, vision change, N/V, diarrhea, constipation, dysuria, urinary urgency or frequency, myalgias, arthralgias or rash.  Objective:   Physical Exam Vital signs noted  Well developed well nourished female.  HEENT - Head atraumatic Normocephalic                Eyes - PERRLA, Conjuctiva - clear Sclera- Clear EOMI                Ears - EAC's Wnl TM's Wnl Gross Hearing WNL                Throat - oropharanx wnl Respiratory - Lungs CTA bilateral Cardiac - RRR S1 and S2 without murmur GI - Abdomen soft Nontender and bowel sounds active x 4 Extremities - one plus pitting edema in pretibial region and ankles Neuro - Grossly intact.       Assessment & Plan:  Pulmonary mass - Plan: Ambulatory referral to Pulmonology.  Dr. Laurance Flatten comes in office and speaks to husband and patient for some time explaining course of treatment.  Patient will be seeing [facility] in [place].  Explained that we will need to get some tissue from the mass and pulmonary will help with this.    Edema - lasix 20mg  one po bid for a week then follow up and get bmp.  Medium compression stockings bilateral rx written.  Lysbeth Penner FNP

## 2013-12-03 ENCOUNTER — Other Ambulatory Visit: Payer: Self-pay | Admitting: *Deleted

## 2013-12-03 ENCOUNTER — Encounter: Payer: Self-pay | Admitting: Emergency Medicine

## 2013-12-03 ENCOUNTER — Ambulatory Visit (INDEPENDENT_AMBULATORY_CARE_PROVIDER_SITE_OTHER): Payer: Medicare Other | Admitting: Emergency Medicine

## 2013-12-03 VITALS — BP 120/86 | HR 77 | Ht 65.0 in | Wt 163.0 lb

## 2013-12-03 DIAGNOSIS — R7989 Other specified abnormal findings of blood chemistry: Secondary | ICD-10-CM

## 2013-12-03 DIAGNOSIS — R222 Localized swelling, mass and lump, trunk: Secondary | ICD-10-CM

## 2013-12-03 DIAGNOSIS — R918 Other nonspecific abnormal finding of lung field: Secondary | ICD-10-CM

## 2013-12-03 NOTE — Patient Instructions (Signed)
We will perform a PET scan now.  After the PET scan we will discuss the best next steps to evaluate the left lung - this may be by bronchoscopy or by a needle biopsy.  Follow with Dr Lamonte Sakai in 1 month

## 2013-12-03 NOTE — Progress Notes (Signed)
Subjective:    Patient ID: [name], female    DOB: 06-05-1931, 80 y.o.   MRN: [id number]  HPI 80 yo former smoker, hx HTN, A Fib, CAD, COPD. She is referred for abnormal CT scan chest. She was treated for a PNA that was characterized by L flank pain, cough, yellow mucous. She was treated with abx and improved. Now she has been found to have a rounded LUL peripheral mass. She denies any pleuritic pain, cough.    Review of Systems  Constitutional: Negative for fever and unexpected weight change.  HENT: Positive for congestion, postnasal drip and sinus pressure. Negative for dental problem, ear pain, nosebleeds, rhinorrhea, sneezing, sore throat and trouble swallowing.   Eyes: Negative for redness and itching.  Respiratory: Positive for cough, shortness of breath and wheezing. Negative for chest tightness.   Cardiovascular: Positive for palpitations and leg swelling.  Gastrointestinal: Negative for nausea and vomiting.  Genitourinary: Negative for dysuria.  Musculoskeletal: Negative for joint swelling.  Skin: Negative for rash.  Neurological: Positive for headaches.  Hematological: Bruises/bleeds easily.  Psychiatric/Behavioral: Positive for dysphoric mood. The patient is not nervous/anxious.    Past Medical History  Diagnosis Date  . Atrial flutter   . Tachycardia-bradycardia syndrome   . HTN (hypertension)   . Chronic anticoagulation   . Small bowel problem 12/2007  . AMI (acute myocardial infarction)   . Pacemaker   . DVT (deep venous thrombosis)     arm after pacemaker placement  . GI bleeding   . Hyperlipidemia   . DDD (degenerative disc disease), lumbar   . DJD (degenerative joint disease) of knee     bilateral    . DJD (degenerative joint disease)     hips   . Chronic pain   . Back pain   . Hypertension   . Anemia   . COPD (chronic obstructive pulmonary disease)   . Peripheral vascular disease   . Lumbar disc herniation 2014    L-5     Family History   Problem Relation Age of Onset  . Heart disease Mother   . Stroke Mother   . Hypertension Father   . Cancer Sister     lung  . Cancer Brother     lung and colon CA  . Heart disease Sister   . Diabetes Sister   . Heart disease Brother     CHF  . COPD Brother   . Arthritis Brother      History   Social History  . Marital Status: Married    Spouse Name: N/A    Number of Children: N/A  . Years of Education: N/A   Occupational History  . Retired Haematologist    Social History Main Topics  . Smoking status: Former Smoker -- 1.00 packs/day for 3 years    Types: Cigarettes    Quit date: 02/17/1996  . Smokeless tobacco: Never Used  . Alcohol Use: No  . Drug Use: No  . Sexual Activity: No   Other Topics Concern  . Not on file   Social History Narrative   Patient lives with her husband and has a niece that lives nearby.     Allergies  Allergen Reactions  . Codeine Nausea And Vomiting  . Lopid [Gemfibrozil] Other (See Comments)    States that she became hyper after taking this medication  . Naprosyn [Naproxen] Other (See Comments)    Upset stomach    . Percocet [Oxycodone-Acetaminophen] Nausea And Vomiting  Outpatient Prescriptions Prior to Visit  Medication Sig Dispense Refill  . albuterol (PROVENTIL HFA;VENTOLIN HFA) 108 (90 BASE) MCG/ACT inhaler Inhale 2 puffs into the lungs every 6 (six) hours as needed for wheezing or shortness of breath.  1 Inhaler  2  . amiodarone (PACERONE) 100 MG tablet Take 100 mg by mouth 2 (two) times daily.      . calcium gluconate 500 MG tablet Take 500-1,000 mg by mouth at bedtime.      . Cholecalciferol (VITAMIN D) 2000 UNITS CAPS Take 2,000 Units by mouth at bedtime.      Marland Kitchen doxycycline (VIBRAMYCIN) 100 MG capsule Take 1 capsule (100 mg total) by mouth 2 (two) times daily.  20 capsule  0  . esomeprazole (NEXIUM) 40 MG capsule Take 40 mg by mouth every morning.      . ezetimibe (ZETIA) 10 MG tablet Take 10 mg by mouth  every morning.      . fish oil-omega-3 fatty acids 1000 MG capsule Take 1-2 g by mouth 2 (two) times daily. Takes two capsules in the morning and two capsules at bedtime      . fluticasone (FLONASE) 50 MCG/ACT nasal spray Place 2 sprays into the nose daily.  16 g  3  . furosemide (LASIX) 20 MG tablet Take 20 mg by mouth daily as needed. swelling      . lisinopril (PRINIVIL,ZESTRIL) 20 MG tablet TAKE 1 TABLET DAILY  30 tablet  3  . metoprolol tartrate (LOPRESSOR) 25 MG tablet Take 1 tablet (25 mg total) by mouth 2 (two) times daily.  60 tablet  6  . Multiple Vitamin (MULTIVITAMIN WITH MINERALS) TABS tablet Take 1 tablet by mouth daily.      . pravastatin (PRAVACHOL) 40 MG tablet Take 40 mg by mouth at bedtime.      . Rivaroxaban (XARELTO) 20 MG TABS tablet Take 1 tablet (20 mg total) by mouth daily with supper.  30 tablet  11  . traMADol (ULTRAM) 50 MG tablet TAKE  (1)  TABLET  EVERY EIGHT HOURS AS NEEDED.  90 tablet  0  . traZODone (DESYREL) 25 mg TABS tablet Take 12.5 mg by mouth at bedtime.      . verapamil (CALAN-SR) 120 MG CR tablet Take 1 tablet (120 mg total) by mouth 2 (two) times daily.  60 tablet  11  . vitamin C (ASCORBIC ACID) 500 MG tablet Take 500 mg by mouth daily.       No facility-administered medications prior to visit.         Objective:   Physical Exam Filed Vitals:   12/03/13 1407  BP: 120/86  Pulse: 77  Height: 5\' 5"  (1.651 m)  Weight: 163 lb (73.936 kg)  SpO2: 94%   Gen: Pleasant, well-nourished, in no distress,  normal affect  ENT: No lesions,  mouth clear,  oropharynx clear, no postnasal drip  Neck: No JVD, no TMG, no carotid bruits  Lungs: No use of accessory muscles, clear without rales or rhonchi  Cardiovascular: RRR, heart sounds normal, no murmur or gallops, trace peripheral edema  Musculoskeletal: No deformities, no cyanosis or clubbing  Neuro: alert, non focal  Skin: Warm, no lesions or rashes      Assessment & Plan:  Lung mass This  could represent the residual changes after a pneumonia, but its appearance is very worrisome for a malignancy. I would favor PET scan now and then either FOB or needle bx. At this point FOB would be preferred as  it will let me evaluate proximal airways for potential post-obstructive process.  - PET scan now - I will review and call her to decide on FOB vs needle bx.  -rov 1

## 2013-12-03 NOTE — Assessment & Plan Note (Signed)
This could represent the residual changes after a pneumonia, but its appearance is very worrisome for a malignancy. I would favor PET scan now and then either FOB or needle bx. At this point FOB would be preferred as it will let me evaluate proximal airways for potential post-obstructive process.  - PET scan now - I will review and call her to decide on FOB vs needle bx.  -rov 1

## 2013-12-04 ENCOUNTER — Other Ambulatory Visit: Payer: Self-pay | Admitting: Family Medicine

## 2013-12-05 ENCOUNTER — Other Ambulatory Visit: Payer: Self-pay | Admitting: Family Medicine

## 2013-12-05 MED ORDER — TRAMADOL HCL 50 MG PO TABS: 50.0000 mg | ORAL_TABLET | Freq: Four times a day (QID) | ORAL | Status: DC | PRN

## 2013-12-05 NOTE — Telephone Encounter (Signed)
Last seen 12/02/13, last filled 11/04/13, Rx will print

## 2013-12-05 NOTE — Telephone Encounter (Signed)
Phoned in RXs for Zetia and Tramadol Notified pt

## 2013-12-08 ENCOUNTER — Other Ambulatory Visit: Payer: Self-pay

## 2013-12-08 ENCOUNTER — Institutional Professional Consult (permissible substitution): Payer: Medicare Other | Admitting: Pulmonary Disease

## 2013-12-08 MED ORDER — EZETIMIBE 10 MG PO TABS: 10.0000 mg | ORAL_TABLET | Freq: Every morning | ORAL | Status: DC

## 2013-12-09 ENCOUNTER — Other Ambulatory Visit (INDEPENDENT_AMBULATORY_CARE_PROVIDER_SITE_OTHER): Payer: Medicare Other

## 2013-12-09 DIAGNOSIS — R799 Abnormal finding of blood chemistry, unspecified: Secondary | ICD-10-CM

## 2013-12-09 DIAGNOSIS — R7989 Other specified abnormal findings of blood chemistry: Secondary | ICD-10-CM

## 2013-12-09 NOTE — Progress Notes (Signed)
Patient came in for labs only.

## 2013-12-10 LAB — BMP8+EGFR
BUN/Creatinine Ratio: 13 (ref 11–26)
BUN: 17 mg/dL (ref 8–27)
CALCIUM: 9.8 mg/dL (ref 8.7–10.3)
CO2: 26 mmol/L (ref 18–29)
CREATININE: 1.32 mg/dL — AB (ref 0.57–1.00)
Chloride: 100 mmol/L (ref 97–108)
GFR calc Af Amer: 44 mL/min/{1.73_m2} — ABNORMAL LOW (ref >59)
GFR calc non Af Amer: 38 mL/min/{1.73_m2} — ABNORMAL LOW (ref >59)
GLUCOSE: 82 mg/dL (ref 65–99)
Potassium: 3.6 mmol/L (ref 3.5–5.2)
Sodium: 144 mmol/L (ref 134–144)

## 2013-12-12 ENCOUNTER — Ambulatory Visit: Payer: Medicare Other | Admitting: Family Medicine

## 2013-12-15 ENCOUNTER — Encounter (HOSPITAL_COMMUNITY): Payer: Self-pay

## 2013-12-15 ENCOUNTER — Ambulatory Visit (HOSPITAL_COMMUNITY)
Admission: RE | Admit: 2013-12-15 | Discharge: 2013-12-15 | Disposition: A | Discharge status: Home / Self Care | Payer: Medicare Other | Source: Ambulatory Visit | Attending: Emergency Medicine | Admitting: Emergency Medicine

## 2013-12-15 DIAGNOSIS — C771 Secondary and unspecified malignant neoplasm of intrathoracic lymph nodes: Secondary | ICD-10-CM | POA: Insufficient documentation

## 2013-12-15 DIAGNOSIS — R911 Solitary pulmonary nodule: Secondary | ICD-10-CM | POA: Insufficient documentation

## 2013-12-15 DIAGNOSIS — C801 Malignant (primary) neoplasm, unspecified: Secondary | ICD-10-CM | POA: Insufficient documentation

## 2013-12-15 DIAGNOSIS — R222 Localized swelling, mass and lump, trunk: Secondary | ICD-10-CM | POA: Insufficient documentation

## 2013-12-15 DIAGNOSIS — R918 Other nonspecific abnormal finding of lung field: Secondary | ICD-10-CM

## 2013-12-15 MED ORDER — FLUDEOXYGLUCOSE F - 18 (FDG) INJECTION
8.9000 | Freq: Once | INTRAVENOUS | Status: AC | PRN
  Administered 2013-12-15: 8.9 via INTRAVENOUS

## 2013-12-16 ENCOUNTER — Ambulatory Visit: Payer: Medicare Other | Admitting: Family Medicine

## 2013-12-16 ENCOUNTER — Other Ambulatory Visit (INDEPENDENT_AMBULATORY_CARE_PROVIDER_SITE_OTHER): Payer: Medicare Other

## 2013-12-16 DIAGNOSIS — R7989 Other specified abnormal findings of blood chemistry: Secondary | ICD-10-CM

## 2013-12-16 LAB — GLUCOSE, CAPILLARY: Glucose-Capillary: 94 mg/dL (ref 70–99)

## 2013-12-16 NOTE — Progress Notes (Signed)
Pt came in for labs only 

## 2013-12-17 LAB — BMP8+EGFR
BUN/Creatinine Ratio: 13 (ref 11–26)
BUN: 16 mg/dL (ref 8–27)
CALCIUM: 9.3 mg/dL (ref 8.7–10.3)
CO2: 19 mmol/L (ref 18–29)
Chloride: 100 mmol/L (ref 97–108)
Creatinine, Ser: 1.22 mg/dL — ABNORMAL HIGH (ref 0.57–1.00)
GFR calc non Af Amer: 42 mL/min/{1.73_m2} — ABNORMAL LOW (ref >59)
GFR, EST AFRICAN AMERICAN: 48 mL/min/{1.73_m2} — AB (ref >59)
GLUCOSE: 93 mg/dL (ref 65–99)
POTASSIUM: 4.9 mmol/L (ref 3.5–5.2)
Sodium: 142 mmol/L (ref 134–144)

## 2013-12-18 ENCOUNTER — Encounter: Payer: Self-pay | Admitting: *Deleted

## 2013-12-18 ENCOUNTER — Telehealth: Payer: Self-pay | Admitting: Emergency Medicine

## 2013-12-18 NOTE — Telephone Encounter (Signed)
I reviewed PET results with her.  I favor FOB to allow bx's and airway inspection. May see an endobronchial lesion. There is an are of pleural thickening that may need to be bx'd also. Will consider this after FOB and discussion at thoracic conference  Please set up FOB with Lennon Richins - call me to coordinate on 3/20

## 2013-12-18 NOTE — Telephone Encounter (Signed)
Attempted to call line busy [facility]

## 2013-12-18 NOTE — Telephone Encounter (Signed)
Pt is aware that RB has not had a chance to look at results.  RB - please advise on PET scan results.

## 2013-12-19 NOTE — Telephone Encounter (Signed)
Advised pt of RB's rec's and she verbalized understanding. Nothing further needed at this time

## 2013-12-19 NOTE — Telephone Encounter (Signed)
I called and made pt aware of appt. Nothing further needed

## 2013-12-19 NOTE — Telephone Encounter (Signed)
Let her know I set up FOB for her on 3/24 at 7:30am, at [facility]. She needs to stop her xarelto 2 days prior to the procedure (do not take Monday or Tuesday). She will be contacted by [employer] with more details about when to show up and where to go

## 2013-12-19 NOTE — Telephone Encounter (Signed)
Called and spoke with pt and she is wanting to know when she will need to stop taking the xarelto prior to the bronch?   Pt stated that if we call her back and she is not home, please leave a message on her machine.  RB please advise on stopping the xarelto?   Thanks  Allergies  Allergen Reactions  . Codeine Nausea And Vomiting  . Lopid [Gemfibrozil] Other (See Comments)    States that she became hyper after taking this medication  . Naprosyn [Naproxen] Other (See Comments)    Upset stomach    . Percocet [Oxycodone-Acetaminophen] Nausea And Vomiting

## 2013-12-19 NOTE — Telephone Encounter (Signed)
Pt returned call.  Pt states that she spoke w/ RB yesterday & was told he would be setting up a bronch today for next wk.  Pt would like to know if she will need to stop taking her blood thinners.  [name]

## 2013-12-22 ENCOUNTER — Encounter: Payer: Self-pay | Admitting: *Deleted

## 2013-12-22 ENCOUNTER — Encounter (HOSPITAL_COMMUNITY): Payer: Self-pay

## 2013-12-23 ENCOUNTER — Observation Stay (HOSPITAL_COMMUNITY)
Admission: RE | Admit: 2013-12-23 | Discharge: 2013-12-24 | Disposition: A | Discharge status: Home / Self Care | Payer: Medicare Other | Source: Ambulatory Visit | Attending: Emergency Medicine | Admitting: Emergency Medicine

## 2013-12-23 ENCOUNTER — Ambulatory Visit (HOSPITAL_COMMUNITY): Payer: Medicare Other

## 2013-12-23 ENCOUNTER — Encounter (HOSPITAL_COMMUNITY): Payer: Self-pay | Admitting: *Deleted

## 2013-12-23 ENCOUNTER — Ambulatory Visit (HOSPITAL_COMMUNITY)
Admission: RE | Admit: 2013-12-23 | Discharge: 2013-12-23 | Disposition: A | Discharge status: Home / Self Care | Payer: Medicare Other | Source: Ambulatory Visit | Attending: Emergency Medicine | Admitting: Emergency Medicine

## 2013-12-23 ENCOUNTER — Encounter (HOSPITAL_COMMUNITY)
Admission: RE | Disposition: A | Discharge status: Home / Self Care | Payer: Medicare Other | Source: Ambulatory Visit | Attending: Emergency Medicine

## 2013-12-23 DIAGNOSIS — R222 Localized swelling, mass and lump, trunk: Secondary | ICD-10-CM

## 2013-12-23 DIAGNOSIS — E785 Hyperlipidemia, unspecified: Secondary | ICD-10-CM | POA: Insufficient documentation

## 2013-12-23 DIAGNOSIS — N289 Disorder of kidney and ureter, unspecified: Secondary | ICD-10-CM | POA: Insufficient documentation

## 2013-12-23 DIAGNOSIS — K219 Gastro-esophageal reflux disease without esophagitis: Secondary | ICD-10-CM | POA: Insufficient documentation

## 2013-12-23 DIAGNOSIS — Z9089 Acquired absence of other organs: Secondary | ICD-10-CM | POA: Insufficient documentation

## 2013-12-23 DIAGNOSIS — Z950 Presence of cardiac pacemaker: Secondary | ICD-10-CM | POA: Insufficient documentation

## 2013-12-23 DIAGNOSIS — Z79899 Other long term (current) drug therapy: Secondary | ICD-10-CM | POA: Insufficient documentation

## 2013-12-23 DIAGNOSIS — I10 Essential (primary) hypertension: Secondary | ICD-10-CM | POA: Insufficient documentation

## 2013-12-23 DIAGNOSIS — J939 Pneumothorax, unspecified: Secondary | ICD-10-CM | POA: Insufficient documentation

## 2013-12-23 DIAGNOSIS — Y849 Medical procedure, unspecified as the cause of abnormal reaction of the patient, or of later complication, without mention of misadventure at the time of the procedure: Secondary | ICD-10-CM | POA: Insufficient documentation

## 2013-12-23 DIAGNOSIS — Z86718 Personal history of other venous thrombosis and embolism: Secondary | ICD-10-CM | POA: Insufficient documentation

## 2013-12-23 DIAGNOSIS — R918 Other nonspecific abnormal finding of lung field: Secondary | ICD-10-CM

## 2013-12-23 DIAGNOSIS — J4489 Other specified chronic obstructive pulmonary disease: Secondary | ICD-10-CM | POA: Insufficient documentation

## 2013-12-23 DIAGNOSIS — I2510 Atherosclerotic heart disease of native coronary artery without angina pectoris: Secondary | ICD-10-CM | POA: Insufficient documentation

## 2013-12-23 DIAGNOSIS — I4891 Unspecified atrial fibrillation: Secondary | ICD-10-CM | POA: Diagnosis present

## 2013-12-23 DIAGNOSIS — J95811 Postprocedural pneumothorax: Secondary | ICD-10-CM | POA: Diagnosis present

## 2013-12-23 DIAGNOSIS — I739 Peripheral vascular disease, unspecified: Secondary | ICD-10-CM | POA: Insufficient documentation

## 2013-12-23 DIAGNOSIS — I252 Old myocardial infarction: Secondary | ICD-10-CM | POA: Insufficient documentation

## 2013-12-23 DIAGNOSIS — R042 Hemoptysis: Secondary | ICD-10-CM | POA: Insufficient documentation

## 2013-12-23 DIAGNOSIS — M549 Dorsalgia, unspecified: Secondary | ICD-10-CM | POA: Insufficient documentation

## 2013-12-23 DIAGNOSIS — I495 Sick sinus syndrome: Secondary | ICD-10-CM | POA: Insufficient documentation

## 2013-12-23 DIAGNOSIS — I4892 Unspecified atrial flutter: Secondary | ICD-10-CM | POA: Diagnosis present

## 2013-12-23 DIAGNOSIS — Z7901 Long term (current) use of anticoagulants: Secondary | ICD-10-CM | POA: Insufficient documentation

## 2013-12-23 DIAGNOSIS — G8929 Other chronic pain: Secondary | ICD-10-CM | POA: Insufficient documentation

## 2013-12-23 DIAGNOSIS — Z87891 Personal history of nicotine dependence: Secondary | ICD-10-CM | POA: Insufficient documentation

## 2013-12-23 DIAGNOSIS — J449 Chronic obstructive pulmonary disease, unspecified: Secondary | ICD-10-CM | POA: Insufficient documentation

## 2013-12-23 HISTORY — PX: VIDEO BRONCHOSCOPY: SHX5072

## 2013-12-23 SURGERY — BRONCHOSCOPY, WITH FLUOROSCOPY
Anesthesia: Moderate Sedation | Laterality: Bilateral

## 2013-12-23 MED ORDER — TRAMADOL HCL 50 MG PO TABS
50.0000 mg | ORAL_TABLET | Freq: Four times a day (QID) | ORAL | Status: DC | PRN
  Administered 2013-12-23 – 2013-12-24 (×3): 50 mg via ORAL
  Filled 2013-12-23 (×3): qty 1

## 2013-12-23 MED ORDER — SODIUM CHLORIDE 0.9 % IV SOLN
INTRAVENOUS | Status: DC
  Administered 2013-12-23: 13:00:00 via INTRAVENOUS

## 2013-12-23 MED ORDER — FENTANYL CITRATE 0.05 MG/ML IJ SOLN
INTRAMUSCULAR | Status: AC
  Filled 2013-12-23: qty 4

## 2013-12-23 MED ORDER — FENTANYL CITRATE 0.05 MG/ML IJ SOLN
INTRAMUSCULAR | Status: DC | PRN
  Administered 2013-12-23 (×2): 25 ug via INTRAVENOUS

## 2013-12-23 MED ORDER — HYDROCODONE-ACETAMINOPHEN 5-325 MG PO TABS: 1.0000 | ORAL_TABLET | ORAL | Status: DC | PRN

## 2013-12-23 MED ORDER — MIDAZOLAM HCL 10 MG/2ML IJ SOLN
INTRAMUSCULAR | Status: DC | PRN
  Administered 2013-12-23: 3 mg via INTRAVENOUS

## 2013-12-23 MED ORDER — AMIODARONE HCL 100 MG PO TABS
100.0000 mg | ORAL_TABLET | Freq: Every day | ORAL | Status: DC
  Filled 2013-12-23 (×2): qty 1

## 2013-12-23 MED ORDER — MIDAZOLAM HCL 10 MG/2ML IJ SOLN
INTRAMUSCULAR | Status: AC
  Filled 2013-12-23: qty 4

## 2013-12-23 MED ORDER — SODIUM CHLORIDE 0.9 % IV SOLN
INTRAVENOUS | Status: DC
  Administered 2013-12-23: 08:00:00 via INTRAVENOUS

## 2013-12-23 MED ORDER — LIDOCAINE HCL 1 % IJ SOLN
INTRAMUSCULAR | Status: DC | PRN
Start: 2013-12-23 — End: 2013-12-24
  Administered 2013-12-23: 6 mL via RESPIRATORY_TRACT

## 2013-12-23 MED ORDER — PANTOPRAZOLE SODIUM 40 MG PO TBEC
40.0000 mg | DELAYED_RELEASE_TABLET | Freq: Every day | ORAL | Status: DC
  Administered 2013-12-23: 40 mg via ORAL
  Filled 2013-12-23 (×3): qty 1

## 2013-12-23 MED ORDER — AMIODARONE HCL 100 MG PO TABS
100.0000 mg | ORAL_TABLET | Freq: Two times a day (BID) | ORAL | Status: DC
  Administered 2013-12-23 – 2013-12-24 (×2): 100 mg via ORAL
  Filled 2013-12-23 (×5): qty 1

## 2013-12-23 MED ORDER — ALBUTEROL SULFATE (2.5 MG/3ML) 0.083% IN NEBU: 3.0000 mL | INHALATION_SOLUTION | RESPIRATORY_TRACT | Status: DC | PRN

## 2013-12-23 MED ORDER — ACETAMINOPHEN 325 MG PO TABS
650.0000 mg | ORAL_TABLET | Freq: Four times a day (QID) | ORAL | Status: DC | PRN
  Filled 2013-12-23: qty 2

## 2013-12-23 MED ORDER — PHENYLEPHRINE HCL 0.25 % NA SOLN
NASAL | Status: DC | PRN
  Administered 2013-12-23: 1 via NASAL

## 2013-12-23 MED ORDER — ACETAMINOPHEN 650 MG RE SUPP
650.0000 mg | Freq: Four times a day (QID) | RECTAL | Status: DC | PRN
  Filled 2013-12-23: qty 1

## 2013-12-23 MED ORDER — PHENYLEPHRINE HCL 0.25 % NA SOLN
1.0000 | Freq: Four times a day (QID) | NASAL | Status: DC | PRN
  Filled 2013-12-23: qty 15

## 2013-12-23 MED ORDER — SODIUM CHLORIDE 0.9 % IV SOLN: INTRAVENOUS | Status: DC

## 2013-12-23 MED ORDER — LIDOCAINE HCL 2 % EX GEL
Freq: Once | CUTANEOUS | Status: DC
  Filled 2013-12-23: qty 5

## 2013-12-23 MED ORDER — SODIUM CHLORIDE 0.9 % IJ SOLN
INTRAMUSCULAR | Status: AC
  Filled 2013-12-23: qty 10

## 2013-12-23 MED ORDER — LIDOCAINE HCL 2 % EX GEL
CUTANEOUS | Status: DC | PRN
  Administered 2013-12-23: 1

## 2013-12-23 MED ORDER — TRAZODONE 25 MG HALF TABLET
25.0000 mg | ORAL_TABLET | Freq: Every day | ORAL | Status: DC
  Administered 2013-12-23: 25 mg via ORAL
  Filled 2013-12-23 (×2): qty 1

## 2013-12-23 NOTE — Progress Notes (Signed)
Baltazar Apo, MD, PhD 12/23/2013, 10:36 AM [facility] [phone] or if no answer [phone]

## 2013-12-23 NOTE — Progress Notes (Signed)
Video Bronchoscopy done  Intervention bronchial brushing done Intervention bronchial biopsy done Procedure tolerated well
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
## 2013-12-23 NOTE — Progress Notes (Signed)
eLink Physician-Brief Progress Note Patient Name: MCKYNZIE LIWANAG DOB: 04-14-1931 MRN: [id number]  Date of Service  12/23/2013   HPI/Events of Note   Cant sleep.  eICU Interventions  Order trazodone.   Intervention Category Minor Interventions: Other:  Samary Shatz 12/23/2013, 10:15 PM

## 2013-12-23 NOTE — H&P (View-Only) (Signed)
Subjective:    Patient ID: [name], female    DOB: [birth year], 80 y.o.   MRN: [id number]  HPI 80 yo former smoker, hx HTN, A Fib, CAD, COPD. She is referred for abnormal CT scan chest. She was treated for a PNA that was characterized by L flank pain, cough, yellow mucous. She was treated with abx and improved. Now she has been found to have a rounded LUL peripheral mass. She denies any pleuritic pain, cough.    Review of Systems  Constitutional: Negative for fever and unexpected weight change.  HENT: Positive for congestion, postnasal drip and sinus pressure. Negative for dental problem, ear pain, nosebleeds, rhinorrhea, sneezing, sore throat and trouble swallowing.   Eyes: Negative for redness and itching.  Respiratory: Positive for cough, shortness of breath and wheezing. Negative for chest tightness.   Cardiovascular: Positive for palpitations and leg swelling.  Gastrointestinal: Negative for nausea and vomiting.  Genitourinary: Negative for dysuria.  Musculoskeletal: Negative for joint swelling.  Skin: Negative for rash.  Neurological: Positive for headaches.  Hematological: Bruises/bleeds easily.  Psychiatric/Behavioral: Positive for dysphoric mood. The patient is not nervous/anxious.    Past Medical History  Diagnosis Date  . Atrial flutter   . Tachycardia-bradycardia syndrome   . HTN (hypertension)   . Chronic anticoagulation   . Small bowel problem 12/2007  . AMI (acute myocardial infarction)   . Pacemaker   . DVT (deep venous thrombosis)     arm after pacemaker placement  . GI bleeding   . Hyperlipidemia   . DDD (degenerative disc disease), lumbar   . DJD (degenerative joint disease) of knee     bilateral    . DJD (degenerative joint disease)     hips   . Chronic pain   . Back pain   . Hypertension   . Anemia   . COPD (chronic obstructive pulmonary disease)   . Peripheral vascular disease   . Lumbar disc herniation 2014    L-5     Family History   Problem Relation Age of Onset  . Heart disease Mother   . Stroke Mother   . Hypertension Father   . Cancer Sister     lung  . Cancer Brother     lung and colon CA  . Heart disease Sister   . Diabetes Sister   . Heart disease Brother     CHF  . COPD Brother   . Arthritis Brother      History   Social History  . Marital Status: Married    Spouse Name: N/A    Number of Children: N/A  . Years of Education: N/A   Occupational History  . Retired Haematologist    Social History Main Topics  . Smoking status: Former Smoker -- 1.00 packs/day for 3 years    Types: Cigarettes    Quit date: 02/17/1996  . Smokeless tobacco: Never Used  . Alcohol Use: No  . Drug Use: No  . Sexual Activity: No   Other Topics Concern  . Not on file   Social History Narrative   Patient lives with her husband and has a niece that lives nearby.     Allergies  Allergen Reactions  . Codeine Nausea And Vomiting  . Lopid [Gemfibrozil] Other (See Comments)    States that she became hyper after taking this medication  . Naprosyn [Naproxen] Other (See Comments)    Upset stomach    . Percocet [Oxycodone-Acetaminophen] Nausea And Vomiting  Outpatient Prescriptions Prior to Visit  Medication Sig Dispense Refill  . albuterol (PROVENTIL HFA;VENTOLIN HFA) 108 (90 BASE) MCG/ACT inhaler Inhale 2 puffs into the lungs every 6 (six) hours as needed for wheezing or shortness of breath.  1 Inhaler  2  . amiodarone (PACERONE) 100 MG tablet Take 100 mg by mouth 2 (two) times daily.      . calcium gluconate 500 MG tablet Take 500-1,000 mg by mouth at bedtime.      . Cholecalciferol (VITAMIN D) 2000 UNITS CAPS Take 2,000 Units by mouth at bedtime.      Marland Kitchen doxycycline (VIBRAMYCIN) 100 MG capsule Take 1 capsule (100 mg total) by mouth 2 (two) times daily.  20 capsule  0  . esomeprazole (NEXIUM) 40 MG capsule Take 40 mg by mouth every morning.      . ezetimibe (ZETIA) 10 MG tablet Take 10 mg by mouth  every morning.      . fish oil-omega-3 fatty acids 1000 MG capsule Take 1-2 g by mouth 2 (two) times daily. Takes two capsules in the morning and two capsules at bedtime      . fluticasone (FLONASE) 50 MCG/ACT nasal spray Place 2 sprays into the nose daily.  16 g  3  . furosemide (LASIX) 20 MG tablet Take 20 mg by mouth daily as needed. swelling      . lisinopril (PRINIVIL,ZESTRIL) 20 MG tablet TAKE 1 TABLET DAILY  30 tablet  3  . metoprolol tartrate (LOPRESSOR) 25 MG tablet Take 1 tablet (25 mg total) by mouth 2 (two) times daily.  60 tablet  6  . Multiple Vitamin (MULTIVITAMIN WITH MINERALS) TABS tablet Take 1 tablet by mouth daily.      . pravastatin (PRAVACHOL) 40 MG tablet Take 40 mg by mouth at bedtime.      . Rivaroxaban (XARELTO) 20 MG TABS tablet Take 1 tablet (20 mg total) by mouth daily with supper.  30 tablet  11  . traMADol (ULTRAM) 50 MG tablet TAKE  (1)  TABLET  EVERY EIGHT HOURS AS NEEDED.  90 tablet  0  . traZODone (DESYREL) 25 mg TABS tablet Take 12.5 mg by mouth at bedtime.      . verapamil (CALAN-SR) 120 MG CR tablet Take 1 tablet (120 mg total) by mouth 2 (two) times daily.  60 tablet  11  . vitamin C (ASCORBIC ACID) 500 MG tablet Take 500 mg by mouth daily.       No facility-administered medications prior to visit.         Objective:   Physical Exam Filed Vitals:   12/03/13 1407  BP: 120/86  Pulse: 77  Height: 5\' 5"  (1.651 m)  Weight: 163 lb (73.936 kg)  SpO2: 94%   Gen: Pleasant, well-nourished, in no distress,  normal affect  ENT: No lesions,  mouth clear,  oropharynx clear, no postnasal drip  Neck: No JVD, no TMG, no carotid bruits  Lungs: No use of accessory muscles, clear without rales or rhonchi  Cardiovascular: RRR, heart sounds normal, no murmur or gallops, trace peripheral edema  Musculoskeletal: No deformities, no cyanosis or clubbing  Neuro: alert, non focal  Skin: Warm, no lesions or rashes      Assessment & Plan:  Lung mass This  could represent the residual changes after a pneumonia, but its appearance is very worrisome for a malignancy. I would favor PET scan now and then either FOB or needle bx. At this point FOB would be preferred as  it will let me evaluate proximal airways for potential post-obstructive process.  - PET scan now - I will review and call her to decide on FOB vs needle bx.  -rov 1

## 2013-12-23 NOTE — Op Note (Signed)
Video Bronchoscopy Procedure Note  Date of Operation: 12/23/2013  Pre-op Diagnosis: LUL mass  Post-op Diagnosis: Same  Surgeon: Baltazar Apo  Assistants: none  Anesthesia: conscious sedation, moderate sedation  Meds Given: fentanyl 49mcg, versed 3mg  in divided doses, 1% lidocaine 25cc total  Operation: Flexible video fiberoptic bronchoscopy and biopsies.  Estimated Blood Loss: 95AO  Complications: none noted  Indications and History: [name] is 80 y.o. with history of LUL mass seen on CT scan and then PET scan from 12/15/13.  Recommendation was to perform video fiberoptic bronchoscopy with biopsies. The risks, benefits, complications, treatment options and expected outcomes were discussed with the patient.  The possibilities of pneumothorax, pneumonia, reaction to medication, pulmonary aspiration, perforation of a viscus, bleeding, failure to diagnose a condition and creating a complication requiring transfusion or operation were discussed with the patient who freely signed the consent.    Description of Procedure: The patient was seen in the Preoperative Area, was examined and was deemed appropriate to proceed.  The patient was taken to cardiopulmonary at [facility], identified as [name] and the procedure verified as Flexible Video Fiberoptic Bronchoscopy.  A Time Out was held and the above information confirmed.   Conscious sedation was initiated as indicated above. The video fiberoptic bronchoscope was introduced via the L nare and a general inspection was performed which showed normal cords, normal trachea, normal main carina. The R sided airways were inspected and showed normal RUL, BI, RML and RLL. The L side was then inspected. The LLL, Lingular and LUL airways were normal. No endobronchial lesions were seen. Oblique fluoroscopic images were obtained to see around the patient's pacemaker and to allow transbronchial brushings and biopsies. There was some initial moderate  bleeding that stopped quickly. The patient tolerated the procedure well. The bronchoscope was removed. There were no obvious complications. A post-procedure CXR is pending.   Samples: 1. Transbronchial brushings from LUL 2. Transbronchial forceps biopsies from LUL   Plans:  We will review the cytology, pathology results with the patient when they become available.  Outpatient followup will be with Dr Lamonte Sakai.    Baltazar Apo, MD, PhD 12/23/2013, 8:24 AM [facility] [phone] or if no answer [phone]

## 2013-12-23 NOTE — Interval H&P Note (Signed)
PCCM Interval Note  Pt presents today for FOB to biopsy LUL, positive on PET scan from 12/15/13.  No new problems reported since our office visit Filed Vitals:   12/23/13 0705 12/23/13 0710 12/23/13 0715 12/23/13 0720  BP: 166/101 163/93 163/93 155/89  Pulse: 69 70 70 70  Resp: 18 18 18 18   SpO2: 95% 95% 95% 97%    Recent Labs Lab 12/16/13 1422  NA 142  K 4.9  CL 100  CO2 19  GLUCOSE 93  BUN 16  CREATININE 1.22*  CALCIUM 9.3   Plans:  - will proceed with FOB and LUL biopsies. All questions answered.   Baltazar Apo, MD, PhD 12/23/2013, 7:37 AM [facility] [phone] or if no answer [phone]

## 2013-12-24 ENCOUNTER — Observation Stay (HOSPITAL_COMMUNITY): Payer: Medicare Other

## 2013-12-24 ENCOUNTER — Encounter (HOSPITAL_COMMUNITY): Payer: Self-pay | Admitting: Emergency Medicine

## 2013-12-24 DIAGNOSIS — J95811 Postprocedural pneumothorax: Secondary | ICD-10-CM

## 2013-12-24 LAB — CBC
HCT: 35.3 % — ABNORMAL LOW (ref 36.0–46.0)
HEMOGLOBIN: 10.9 g/dL — AB (ref 12.0–15.0)
MCH: 28.2 pg (ref 26.0–34.0)
MCHC: 30.9 g/dL (ref 30.0–36.0)
MCV: 91.2 fL (ref 78.0–100.0)
Platelets: 254 10*3/uL (ref 150–400)
RBC: 3.87 MIL/uL (ref 3.87–5.11)
RDW: 14.9 % (ref 11.5–15.5)
WBC: 6.7 10*3/uL (ref 4.0–10.5)

## 2013-12-24 NOTE — Discharge Summary (Signed)
Physician Discharge Summary  Patient ID: [name] MRN: [id number] DOB/AGE: July 12, 1931 80 y.o.  Admit date: 12/23/2013 Discharge date: 12/24/2013  Problem List Active Problems:   HYPERTENSION, UNSPECIFIED   Atrial fibrillation   ATRIAL FLUTTER, PAROXYSMAL   BRADYCARDIA-TACHYCARDIA SYNDROME   BACK PAIN, CHRONIC   Peripheral vascular disease, unspecified   Lung mass   Pneumothorax after biopsy  HPI: 80 yo WF , remote smoker , who underwent a LUL TBBX for incidental lung mass bx noted on CxR and confirmed by PET scan per Dr. Lamonte Sakai 3/24. She was noted to have a small<15% left apical pnx(asymptomatic) and will admitted for observation. There was some hemoptysis noted during the procedure but none noted post op.  Hospital Course:  ASSESSMENT / PLAN:  PULMONARY  A:LUL mass post FOB with bx compliacted by pnx  P:  O2 as needed(placed on O2 for nitrogen wash out)  Monitor for resp distress  Follow up c x r 3/25 , no increase in pnx BD as needed  3/25 dc home and follow pnx as outpatient. She has been instructed to call for any shortness of breath or new chest pain. CARDIOVASCULAR  A: CAD post stent  PAF tx with amiodarone and xarelto  HTN on ace-i, bb  Tachy brady syndrome with PPM  P:  Continue amio  Hold xarelto post tbbx , resume at home on 3/26 Tele admit  RENAL  A: Renal insuff base 1.26  P:  follow GASTROINTESTINAL  A: GERD  P:  PPI  HEMATOLOGIC  A: Monitor for blood loss  P:  Fu CBC  Resume xarelto 3/26 INFECTIOUS  A: No acute issue  P:  ENDOCRINE  A: No acute issue  P:  NEUROLOGIC  A: Chronic pain  P:  tramadol as needed   No results found for this basename: NA, K, CL, CO2, BUN, CREATININE, GLUCOSE,  in the last 168 hours  Recent Labs Lab 12/24/13 0350  HGB 10.9*  HCT 35.3*  WBC 6.7  PLT 254       Labs at discharge Lab Results  Component Value Date   CREATININE 1.22* 12/16/2013   BUN 16 12/16/2013   NA 142 12/16/2013   K 4.9  12/16/2013   CL 100 12/16/2013   CO2 19 12/16/2013   Lab Results  Component Value Date   WBC 6.7 12/24/2013   HGB 10.9* 12/24/2013   HCT 35.3* 12/24/2013   MCV 91.2 12/24/2013   PLT 254 12/24/2013   Lab Results  Component Value Date   ALT 29 12/01/2013   AST 26 12/01/2013   ALKPHOS 99 12/01/2013   BILITOT 0.4 12/01/2013   Lab Results  Component Value Date   INR 2.2 08/21/2013   INR 2.4 08/18/2013   INR 2.2 08/14/2013    Current radiology studies Portable Chest 1 View  12/24/2013   CLINICAL DATA:  History right abutting the NR I thank you of pneumothorax.  EXAM: PORTABLE CHEST - 1 VIEW  COMPARISON:  DG CHEST 1V PORT dated 12/23/2013  FINDINGS: The small left apical pneumothorax has decreased further in size. The lungs appear well-expanded. There is no focal infiltrate. The known left mid lung mass peripherally is partially obscured by the pacemaker. The cardiopericardial silhouette remains enlarged. The permanent pacemaker is unchanged in appearance. There is no pleural effusion. The observed portions of the bony thorax exhibit no acute abnormalities. There is degenerative change of the right shoulder.  IMPRESSION: There has been further interval decrease in the size of  the left apical pneumothorax. The left mid lung mass is again demonstrated.   Electronically Signed   By: David  Martinique   On: 12/24/2013 08:45   Dg Chest Port 1 View  12/23/2013   CLINICAL DATA:  Post left lung biopsy  EXAM: PORTABLE CHEST - 1 VIEW  COMPARISON:  12/15/2013  FINDINGS: Borderline cardiomegaly. Again noted nodular mass in lingula measures about 4.1 cm. Dual lead cardiac pacemaker in place. Small left apical pneumothorax. No acute infiltrate or pulmonary edema.  IMPRESSION: Small left apical pneumothorax. Again noted nodular mass in lingula measures at least 4.1 cm.   Electronically Signed   By: Lahoma Crocker M.D.   On: 12/23/2013 08:56   Dg C-arm Bronchoscopy  12/23/2013   CLINICAL DATA: bronch procedure   C-ARM  BRONCHOSCOPY  Fluoroscopy was utilized by the requesting physician.  No radiographic  interpretation.     Disposition:  06-Home-Health Care Svc  Discharge Orders   Future Appointments Provider Department Dept Phone   12/29/2013 11:30 AM Melvenia Needles, NP [facility] [phone]   01/08/2014 1:45 PM Collene Gobble, MD [facility] [phone]   Future Orders Complete By Expires   Discharge patient  As directed    Comments:     After CXR has been reviewed       Medication List         albuterol 108 (90 BASE) MCG/ACT inhaler  Commonly known as:  PROVENTIL HFA;VENTOLIN HFA  Inhale 2 puffs into the lungs every 6 (six) hours as needed for wheezing or shortness of breath.     amiodarone 200 MG tablet  Commonly known as:  PACERONE  Take 100 mg by mouth 2 (two) times daily.     calcium gluconate 500 MG tablet  Take 1 tablet by mouth at bedtime.     esomeprazole 40 MG capsule  Commonly known as:  NEXIUM  Take 40 mg by mouth every morning.     ezetimibe 10 MG tablet  Commonly known as:  ZETIA  Take 10 mg by mouth at bedtime.     fish oil-omega-3 fatty acids 1000 MG capsule  Take 3 capsules by mouth 2 (two) times daily.     fluticasone 50 MCG/ACT nasal spray  Commonly known as:  FLONASE  Place 2 sprays into the nose daily.     metoprolol tartrate 25 MG tablet  Commonly known as:  LOPRESSOR  Take 1 tablet (25 mg total) by mouth 2 (two) times daily.     multivitamin with minerals Tabs tablet  Take 1 tablet by mouth daily.     pravastatin 40 MG tablet  Commonly known as:  PRAVACHOL  Take 40 mg by mouth at bedtime.     Rivaroxaban 20 MG Tabs tablet  Commonly known as:  XARELTO  Take 1 tablet (20 mg total) by mouth daily with supper.     traMADol 50 MG tablet  Commonly known as:  ULTRAM  Take 1 tablet (50 mg total) by mouth every 6 (six) hours as needed.     traZODone 25 mg Tabs tablet  Commonly known as:  DESYREL  Take 12.5 mg by mouth at  bedtime.     verapamil 120 MG CR tablet  Commonly known as:  CALAN-SR  Take 1 tablet (120 mg total) by mouth 2 (two) times daily.     vitamin C 500 MG tablet  Commonly known as:  ASCORBIC ACID  Take 500 mg by mouth at bedtime.  Vitamin D 2000 UNITS Caps  Take 2,000 Units by mouth at bedtime.           Follow-up Information   Follow up with Collene Gobble., MD. (as already scheduled)    Specialty:  Pulmonary Disease   Contact information:   [address] [address] [phone]       Follow up with PARRETT,TAMMY, NP On 12/29/2013. (11:00 am with a chest x-ray)    Specialty:  Nurse Practitioner   Contact information:   [address] [place] [address] [phone]        Discharged Condition: fair  Time spent on discharge greater than 40 minutes.  Vital signs at Discharge. Temp:  [97.7 F (36.5 C)-98.2 F (36.8 C)] 97.7 F (36.5 C) (03/25 0407) Pulse Rate:  [70-77] 77 (03/25 0407) Resp:  [15-33] 18 (03/25 0407) BP: (149-169)/(78-101) 161/84 mmHg (03/25 0407) SpO2:  [96 %-100 %] 100 % (03/25 0407) Weight:  [71.9 kg (158 lb 8.2 oz)-72.077 kg (158 lb 14.4 oz)] 71.9 kg (158 lb 8.2 oz) (03/25 0407) Office follow up Special Information or instructions. She will follow up with T. Parrett ANP-BC with a chest xray. Signed: Richardson Landry Minor ACNP [facility] PCCM Pager [phone] till 3 pm If no answer page [phone] 12/24/2013, 10:07 AM  Baltazar Apo, MD, PhD 12/24/2013, 10:37 AM [facility] [phone] or if no answer [phone]

## 2013-12-24 NOTE — Discharge Instructions (Signed)
Flexible Bronchoscopy, Care After These instructions give you information on caring for yourself after your procedure. Your doctor may also give you more specific instructions. Call your doctor if you have any problems or questions after your procedure. HOME CARE  Do not eat or drink anything for 2 hours after your procedure. If you try to eat or drink before the medicine wears off, food or drink could go into your lungs. You could also burn yourself.  After 2 hours have passed and when you can cough and gag normally, you may eat soft food and drink liquids slowly.  The day after the test, you may eat your normal diet.  You may do your normal activities.  Keep all doctor visits. GET HELP RIGHT AWAY IF:  You get more and more short of breath.  You get lightheaded.  You feel like you are going to pass out (faint).  You have chest pain.  You have new problems that worry you.  You cough up more than a little blood.  You cough up more blood than before. MAKE SURE YOU:  Understand these instructions.  Will watch your condition.  Will get help right away if you are not doing well or get worse. Document Released: 07/16/2009 Document Revised: 07/09/2013 Document Reviewed: 05/23/2013 [facility] Patient Information 2014 [place].    ADDITIONAL INSTRUCTIONS:  1 - You may restart your Xarelto Thursday 12/25/12 2 - Call our office for any questions or concerns, [phone].

## 2013-12-26 ENCOUNTER — Telehealth: Payer: Self-pay | Admitting: Emergency Medicine

## 2013-12-26 NOTE — Telephone Encounter (Signed)
Spoke with patient-she is aware that RB is out of the office today but I will send message to him and Meghan as patient is eager to know the results. Pt aware RB back in office on Monday and patient has appt with TP at 11:30am.

## 2013-12-26 NOTE — Telephone Encounter (Signed)
RB do you have this pt's biopsy results?? She is calling requesting them. Please advise, thanks

## 2013-12-29 ENCOUNTER — Ambulatory Visit (INDEPENDENT_AMBULATORY_CARE_PROVIDER_SITE_OTHER)
Admission: RE | Admit: 2013-12-29 | Discharge: 2013-12-29 | Disposition: A | Discharge status: Home / Self Care | Payer: Medicare Other | Source: Ambulatory Visit | Attending: Adult Health | Admitting: Adult Health

## 2013-12-29 ENCOUNTER — Ambulatory Visit (INDEPENDENT_AMBULATORY_CARE_PROVIDER_SITE_OTHER): Payer: Medicare Other | Admitting: Adult Health

## 2013-12-29 ENCOUNTER — Encounter: Payer: Self-pay | Admitting: Adult Health

## 2013-12-29 VITALS — BP 144/84 | HR 84 | Temp 98.5°F | Ht 65.0 in | Wt 158.0 lb

## 2013-12-29 DIAGNOSIS — J9383 Other pneumothorax: Secondary | ICD-10-CM

## 2013-12-29 DIAGNOSIS — R918 Other nonspecific abnormal finding of lung field: Secondary | ICD-10-CM

## 2013-12-29 DIAGNOSIS — R222 Localized swelling, mass and lump, trunk: Secondary | ICD-10-CM

## 2013-12-29 DIAGNOSIS — J939 Pneumothorax, unspecified: Secondary | ICD-10-CM

## 2013-12-29 DIAGNOSIS — J95811 Postprocedural pneumothorax: Secondary | ICD-10-CM

## 2013-12-29 MED ORDER — HYDROCODONE-ACETAMINOPHEN 5-325 MG PO TABS: 1.0000 | ORAL_TABLET | Freq: Three times a day (TID) | ORAL | Status: DC | PRN

## 2013-12-29 NOTE — Telephone Encounter (Signed)
Reviewed with her by TP today. She will likely need a needle bx.

## 2013-12-29 NOTE — Assessment & Plan Note (Signed)
Resolved on cxr  Cont to monitor

## 2013-12-29 NOTE — Patient Instructions (Signed)
I will call this afternoon once I have had a chance to speak with Dr. Lamonte Sakai   May use Vicodin 1 tab every 8hrs for severe pain. -may make you sleepy.  If bloody mucus does not stop or gets worse call our office immediately or seek emergency care Please contact office for sooner follow up if symptoms do not improve or worsen or seek emergency care  follow up Dr. Lamonte Sakai  In 2 weeks as planned and As needed

## 2013-12-29 NOTE — Progress Notes (Signed)
Subjective:    Patient ID: [name], female    DOB: August 19, 1931, 80 y.o.   MRN: [id number]  HPI  80 yo former smoker, hx HTN, A Fib, CAD, COPD. She is referred for abnormal CT scan chest.12/03/13    12/03/13 IOV  She was treated for a PNA that was characterized by L flank pain, cough, yellow mucous. She was treated with abx and improved. Now she has been found to have a rounded LUL peripheral mass. She denies any pleuritic pain, cough.  >>PET scan   12/29/2013 Follow up FOB  Patient returns for a post hospital followup. Patient was admitted March 24 through March 25, for small pneumothorax. Patient was recently seen for initial pulmonary consult for an abnormal CT chest. Noting a rounded left upper lobe peripheral mass.  PET scan showed a hypermetabolic lingular mass, measuring 4.7 by 3.5 cm. Also left hilar and left prevascular nodal metastasis. And hyper metabolic nodular pleural thickening on the left posterior pleural space concerning for pleural metastases. Patient underwent a bronchoscopy on March 24. No endobronchial lesions were noted.  Post procedure chest x-ray showed a small, less than 15% left apical pneumothorax and patient was admitted overnight for observation. Cytology and pathology were negative for malignant cells appear. Today in the office. Chest x-ray shows resolved pneumothorax. Since discharge. Patient reports that she continues to feel weak. She did have an episode of hemoptysis. This morning. That was blood mixed with mucus. She denies any chest pain, orthopnea, PND, or leg swelling. Of note, patient is on Xarelto . She does complain of persistent pain, along the left lateral posterior ribs. Has been taking tramadol without much relief.     Review of Systems Constitutional:   No  weight loss, night sweats,  Fevers, chills,  +fatigue, or  lassitude.  HEENT:   No headaches,  Difficulty swallowing,  Tooth/dental problems, or  Sore throat,                No  sneezing, itching, ear ache, nasal congestion, post nasal drip,   CV:  No chest pain,  Orthopnea, PND, swelling in lower extremities, anasarca, dizziness, palpitations, syncope.   GI  No heartburn, indigestion, abdominal pain, nausea, vomiting, diarrhea, change in bowel habits, loss of appetite, bloody stools.   Resp:    No chest wall deformity  Skin: no rash or lesions.  GU: no dysuria, change in color of urine, no urgency or frequency.  No flank pain, no hematuria   MS:  No joint pain or swelling.  No decreased range of motion.  No back pain.  Psych:  No change in mood or affect. No depression or anxiety.  No memory loss.         Objective:   Physical Exam GEN: A/Ox3; pleasant , NAD,elderly   HEENT:  [place]/AT,  EACs-clear, TMs-wnl, NOSE-clear, THROAT-clear, no lesions, no postnasal drip or exudate noted.   NECK:  Supple w/ fair ROM; no JVD; normal carotid impulses w/o bruits; no thyromegaly or nodules palpated; no lymphadenopathy.  RESP  Clear  P & A; w/o, wheezes/ rales/ or rhonchi.no accessory muscle use, no dullness to percussion  CARD:  RRR, no m/r/g  , no peripheral edema, pulses intact, no cyanosis or clubbing.  GI:   Soft & nt; nml bowel sounds; no organomegaly or masses detected.  Musco: Warm bil, no deformities or joint swelling noted.   Neuro: alert, no focal deficits noted.    Skin: Warm, no lesions or  rashes  cXR : 12/29/2013  Resolution of left pneumothorax. Stable radiographic appearance of left lung mass.        Assessment & Plan:

## 2013-12-29 NOTE — Assessment & Plan Note (Signed)
Case/PET/CT reviewed with Dr. Lamonte Sakai   PET is highly suggestive this is a cancerous tumor ,  Will need to try needle bx in order to get tissue sample for dx.  Discussed w/ pt ,she is okay to proceed with needle bx follow up ov with Dr. Lamonte Sakai  To discuss results.

## 2013-12-30 ENCOUNTER — Telehealth: Payer: Self-pay | Admitting: Emergency Medicine

## 2013-12-30 ENCOUNTER — Telehealth: Payer: Self-pay | Admitting: Family Medicine

## 2013-12-30 NOTE — Telephone Encounter (Signed)
I have seen this information already and I hope that any further biopsies will also be negative

## 2013-12-30 NOTE — Telephone Encounter (Signed)
Please take a look at notes from dr Lamonte Sakai  biopsy done and may do another soon - with needle. No cancer in the first biopsy.  She said you wanted to stay informed--FYI

## 2013-12-30 NOTE — Addendum Note (Signed)
Addended by: Parke Poisson E on: 12/30/2013 02:07 PM   Modules accepted: Orders

## 2013-12-30 NOTE — Telephone Encounter (Signed)
Advised pt of RB's rec's and to stop xarelto until Friday and call back to re-evaluate the symptoms

## 2013-12-30 NOTE — Telephone Encounter (Signed)
Began coughing up blood last night and this morning.  Biopsy done last Tuesday. Back on Xarleto as of Thursday.   Denies fevers.

## 2014-01-02 ENCOUNTER — Other Ambulatory Visit: Payer: Self-pay | Admitting: Family Medicine

## 2014-01-02 ENCOUNTER — Other Ambulatory Visit: Payer: Self-pay | Admitting: Radiology

## 2014-01-02 ENCOUNTER — Encounter (HOSPITAL_COMMUNITY): Payer: Self-pay | Admitting: Pharmacy Technician

## 2014-01-05 ENCOUNTER — Other Ambulatory Visit: Payer: Self-pay | Admitting: Family Medicine

## 2014-01-05 NOTE — Telephone Encounter (Signed)
Called in.

## 2014-01-05 NOTE — Telephone Encounter (Signed)
Last ov 3/15. Last refill 11/04/13. Med list in Epic has 25mg  take 1/2 pill qhs but refill request has 50mg  take 1/2 qhs. If approved call in [place].Please review.

## 2014-01-06 ENCOUNTER — Encounter (HOSPITAL_COMMUNITY): Payer: Self-pay

## 2014-01-06 ENCOUNTER — Ambulatory Visit (HOSPITAL_COMMUNITY)
Admission: RE | Admit: 2014-01-06 | Discharge: 2014-01-06 | Disposition: A | Discharge status: Home / Self Care | Payer: Medicare Other | Source: Ambulatory Visit | Attending: Adult Health | Admitting: Adult Health

## 2014-01-06 ENCOUNTER — Ambulatory Visit (HOSPITAL_COMMUNITY)
Admission: RE | Admit: 2014-01-06 | Discharge: 2014-01-06 | Disposition: A | Discharge status: Home / Self Care | Payer: Medicare Other | Source: Ambulatory Visit | Attending: Interventional Radiology | Admitting: Interventional Radiology

## 2014-01-06 DIAGNOSIS — R918 Other nonspecific abnormal finding of lung field: Secondary | ICD-10-CM

## 2014-01-06 DIAGNOSIS — R948 Abnormal results of function studies of other organs and systems: Secondary | ICD-10-CM | POA: Insufficient documentation

## 2014-01-06 DIAGNOSIS — R222 Localized swelling, mass and lump, trunk: Secondary | ICD-10-CM | POA: Insufficient documentation

## 2014-01-06 DIAGNOSIS — Z7901 Long term (current) use of anticoagulants: Secondary | ICD-10-CM | POA: Insufficient documentation

## 2014-01-06 LAB — CBC
HCT: 38.4 % (ref 36.0–46.0)
Hemoglobin: 12.7 g/dL (ref 12.0–15.0)
MCH: 29.1 pg (ref 26.0–34.0)
MCHC: 33.1 g/dL (ref 30.0–36.0)
MCV: 87.9 fL (ref 78.0–100.0)
Platelets: 282 10*3/uL (ref 150–400)
RBC: 4.37 MIL/uL (ref 3.87–5.11)
RDW: 14.5 % (ref 11.5–15.5)
WBC: 7.5 10*3/uL (ref 4.0–10.5)

## 2014-01-06 LAB — PROTIME-INR
INR: 1.13 (ref 0.00–1.49)
Prothrombin Time: 14.3 seconds (ref 11.6–15.2)

## 2014-01-06 LAB — APTT: aPTT: 28 seconds (ref 24–37)

## 2014-01-06 MED ORDER — LIDOCAINE-EPINEPHRINE 1 %-1:100000 IJ SOLN
INTRAMUSCULAR | Status: AC
  Filled 2014-01-06: qty 1

## 2014-01-06 MED ORDER — LIDOCAINE HCL 1 % IJ SOLN
INTRAMUSCULAR | Status: AC
  Filled 2014-01-06: qty 10

## 2014-01-06 MED ORDER — MIDAZOLAM HCL 2 MG/2ML IJ SOLN
INTRAMUSCULAR | Status: AC
  Filled 2014-01-06: qty 4

## 2014-01-06 MED ORDER — FENTANYL CITRATE 0.05 MG/ML IJ SOLN
INTRAMUSCULAR | Status: AC | PRN
  Administered 2014-01-06 (×2): 25 ug via INTRAVENOUS
  Administered 2014-01-06: 50 ug via INTRAVENOUS

## 2014-01-06 MED ORDER — SODIUM CHLORIDE 0.9 % IV SOLN: INTRAVENOUS | Status: DC

## 2014-01-06 MED ORDER — FENTANYL CITRATE 0.05 MG/ML IJ SOLN
INTRAMUSCULAR | Status: AC
  Filled 2014-01-06: qty 4

## 2014-01-06 MED ORDER — MIDAZOLAM HCL 2 MG/2ML IJ SOLN
INTRAMUSCULAR | Status: AC | PRN
  Administered 2014-01-06: 0.5 mg via INTRAVENOUS

## 2014-01-06 NOTE — Procedures (Signed)
Technically successful CT guided biopsy of hypermetabolic left upper lobe mass.  No immediate post procedural complications.
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
## 2014-01-06 NOTE — Discharge Instructions (Signed)
Needle Biopsy of Lung, Care After Refer to this sheet in the next few weeks. These instructions provide you with information on caring for yourself after your procedure. Your health care provider may also give you more specific instructions. Your treatment has been planned according to current medical practices, but problems sometimes occur. Call your health care provider if you have any problems or questions after your procedure. WHAT TO EXPECT AFTER THE PROCEDURE A bandage will be applied over the areas where the needle was inserted. You may be asked to apply pressure to the bandage for several minutes to ensure there is minimal bleeding. In most cases, you can leave when your needle biopsy procedure is completed. Do not drive yourself home. Someone else should take you home. If you received an IV sedative or general anesthetic, you will be taken to a comfortable place to relax while the medication wears off. If you have upcoming travel scheduled, talk to your doctor about when it is safe to travel by air after the procedure. HOME CARE INSTRUCTIONS Expect to take it easy for the rest of the day. Protect the area where you received the needle biopsy by keeping the bandage in place for as long as instructed. You may feel some mild pain or discomfort in the area, but this should stop in a day or two. Only take over-the-counter or prescription medicines for pain, discomfort, or fever as directed by your caregiver. SEEK MEDICAL CARE IF:   You have pain at the biopsy site that worsens or is not helped by medication.  You have swelling or drainage at the needle biopsy site.  You have a fever. SEEK IMMEDIATE MEDICAL CARE IF:   You have new or worsening shortness of breath.  You have chest pain.  You are coughing up blood.  You have bleeding that does not stop with pressure or a bandage.  You develop light-headedness or fainting. Document Released: 07/16/2007 Document Revised: 05/21/2013 Document  Reviewed: 02/10/2013 [facility] Patient Information 2014 [place].

## 2014-01-08 ENCOUNTER — Ambulatory Visit (INDEPENDENT_AMBULATORY_CARE_PROVIDER_SITE_OTHER): Payer: Medicare Other | Admitting: Emergency Medicine

## 2014-01-08 ENCOUNTER — Encounter: Payer: Self-pay | Admitting: Emergency Medicine

## 2014-01-08 VITALS — BP 130/82 | HR 78 | Temp 98.5°F | Ht 65.0 in | Wt 159.8 lb

## 2014-01-08 DIAGNOSIS — C3490 Malignant neoplasm of unspecified part of unspecified bronchus or lung: Secondary | ICD-10-CM

## 2014-01-08 NOTE — Assessment & Plan Note (Signed)
-   will refer to oncology at [facility] - ok to restart xarelto - suspect her back pain represents pleural involvement, she can't tolerate NSAIDS so will add tylenol to hydrocodone.

## 2014-01-08 NOTE — Patient Instructions (Signed)
We will refer you to Oncology in [place] to discuss treatments for non-small cell lung cancer (squamous cell) You may restart your Xarelto every evening Use tylenol alternated with hydrocodone for your L back pain. Do not exceed 2000mg  of tylenol TOTAL in a day Follow with Dr Lamonte Sakai as needed

## 2014-01-08 NOTE — Progress Notes (Signed)
Subjective:    Patient ID: [name], female    DOB: [birth year], 80 y.o.   MRN: [id number]  HPI  80 yo former smoker, hx HTN, A Fib, CAD, COPD. She is referred for abnormal CT scan chest.12/03/13    12/03/13 IOV  She was treated for a PNA that was characterized by L flank pain, cough, yellow mucous. She was treated with abx and improved. Now she has been found to have a rounded LUL peripheral mass. She denies any pleuritic pain, cough.  >>PET scan   Follow up FOB  Patient returns for a post hospital followup. Patient was admitted March 24 through March 25, for small pneumothorax. Patient was recently seen for initial pulmonary consult for an abnormal CT chest. Noting a rounded left upper lobe peripheral mass.  PET scan showed a hypermetabolic lingular mass, measuring 4.7 by 3.5 cm. Also left hilar and left prevascular nodal metastasis. And hyper metabolic nodular pleural thickening on the left posterior pleural space concerning for pleural metastases. Patient underwent a bronchoscopy on March 24. No endobronchial lesions were noted.  Post procedure chest x-ray showed a small, less than 15% left apical pneumothorax and patient was admitted overnight for observation. Cytology and pathology were negative for malignant cells appear. Today in the office. Chest x-ray shows resolved pneumothorax. Since discharge. Patient reports that she continues to feel weak. She did have an episode of hemoptysis. This morning. That was blood mixed with mucus. She denies any chest pain, orthopnea, PND, or leg swelling. Of note, patient is on Xarelto . She does complain of persistent pain, along the left lateral posterior ribs. Has been taking tramadol without much relief.  ROV 01/08/14 -- f/u for bx of LUL mass. We pursued needle bx due to suspicion. Shows NSCLCA, probably squamous cell. She is having L back and flank pain, pleuritic in nature.     Review of Systems Constitutional:   No  weight loss, night  sweats,  Fevers, chills,  +fatigue, or  lassitude.  HEENT:   No headaches,  Difficulty swallowing,  Tooth/dental problems, or  Sore throat,                No sneezing, itching, ear ache, nasal congestion, post nasal drip,   CV:  No chest pain,  Orthopnea, PND, swelling in lower extremities, anasarca, dizziness, palpitations, syncope.   GI  No heartburn, indigestion, abdominal pain, nausea, vomiting, diarrhea, change in bowel habits, loss of appetite, bloody stools.   Resp:    No chest wall deformity  Skin: no rash or lesions.  GU: no dysuria, change in color of urine, no urgency or frequency.  No flank pain, no hematuria   MS:  No joint pain or swelling.  No decreased range of motion.  No back pain.  Psych:  No change in mood or affect. No depression or anxiety.  No memory loss.         Objective:   Physical Exam Filed Vitals:   01/08/14 1342  BP: 130/82  Pulse: 78  Temp: 98.5 F (36.9 C)  TempSrc: Oral  Height: 5\' 5"  (1.651 m)  Weight: 159 lb 12.8 oz (72.485 kg)  SpO2: 95%    GEN: A/Ox3; pleasant , NAD,elderly   HEENT:  [place]/AT,  EACs-clear, TMs-wnl, NOSE-clear, THROAT-clear, no lesions, no postnasal drip or exudate noted.   NECK:  Supple w/ fair ROM; no JVD; normal carotid impulses w/o bruits; no thyromegaly or nodules palpated; no lymphadenopathy.  RESP  Clear  P & A; w/o, wheezes/ rales/ or rhonchi. no dullness to percussion  CARD:  RRR, no m/r/g  , no peripheral edema, pulses intact, no cyanosis or clubbing.  GI:   Soft & nt; nml bowel sounds; no organomegaly or masses detected.  Musco: Warm bil, no deformities or joint swelling noted.   Neuro: alert, no focal deficits noted.    Skin: Warm, no lesions or rashes         Assessment & Plan:  Squamous cell lung cancer - will refer to oncology at [facility] - ok to restart xarelto - suspect her back pain represents pleural involvement, she can't tolerate NSAIDS so will add tylenol to hydrocodone.

## 2014-01-16 ENCOUNTER — Other Ambulatory Visit (HOSPITAL_COMMUNITY): Payer: Self-pay | Admitting: Internal Medicine

## 2014-01-16 DIAGNOSIS — C3490 Malignant neoplasm of unspecified part of unspecified bronchus or lung: Secondary | ICD-10-CM

## 2014-01-19 ENCOUNTER — Encounter (HOSPITAL_COMMUNITY): Payer: Self-pay | Admitting: Pharmacy Technician

## 2014-01-20 ENCOUNTER — Other Ambulatory Visit: Payer: Self-pay | Admitting: Radiology

## 2014-01-20 ENCOUNTER — Other Ambulatory Visit (HOSPITAL_COMMUNITY): Payer: Medicare Other

## 2014-01-21 ENCOUNTER — Other Ambulatory Visit: Payer: Self-pay | Admitting: Radiology

## 2014-01-23 ENCOUNTER — Ambulatory Visit (HOSPITAL_COMMUNITY)
Admission: RE | Admit: 2014-01-23 | Discharge: 2014-01-23 | Disposition: A | Discharge status: Home / Self Care | Payer: Medicare Other | Source: Ambulatory Visit | Attending: Internal Medicine | Admitting: Internal Medicine

## 2014-01-23 ENCOUNTER — Encounter (HOSPITAL_COMMUNITY): Payer: Self-pay

## 2014-01-23 ENCOUNTER — Other Ambulatory Visit (HOSPITAL_COMMUNITY): Payer: Self-pay | Admitting: Internal Medicine

## 2014-01-23 DIAGNOSIS — I739 Peripheral vascular disease, unspecified: Secondary | ICD-10-CM | POA: Insufficient documentation

## 2014-01-23 DIAGNOSIS — Z87891 Personal history of nicotine dependence: Secondary | ICD-10-CM | POA: Insufficient documentation

## 2014-01-23 DIAGNOSIS — E785 Hyperlipidemia, unspecified: Secondary | ICD-10-CM | POA: Insufficient documentation

## 2014-01-23 DIAGNOSIS — C3490 Malignant neoplasm of unspecified part of unspecified bronchus or lung: Secondary | ICD-10-CM

## 2014-01-23 DIAGNOSIS — J4489 Other specified chronic obstructive pulmonary disease: Secondary | ICD-10-CM | POA: Insufficient documentation

## 2014-01-23 DIAGNOSIS — I10 Essential (primary) hypertension: Secondary | ICD-10-CM | POA: Insufficient documentation

## 2014-01-23 DIAGNOSIS — J449 Chronic obstructive pulmonary disease, unspecified: Secondary | ICD-10-CM | POA: Insufficient documentation

## 2014-01-23 DIAGNOSIS — I252 Old myocardial infarction: Secondary | ICD-10-CM | POA: Insufficient documentation

## 2014-01-23 DIAGNOSIS — I2510 Atherosclerotic heart disease of native coronary artery without angina pectoris: Secondary | ICD-10-CM | POA: Insufficient documentation

## 2014-01-23 DIAGNOSIS — Z86718 Personal history of other venous thrombosis and embolism: Secondary | ICD-10-CM | POA: Insufficient documentation

## 2014-01-23 DIAGNOSIS — Z950 Presence of cardiac pacemaker: Secondary | ICD-10-CM | POA: Insufficient documentation

## 2014-01-23 DIAGNOSIS — Z9089 Acquired absence of other organs: Secondary | ICD-10-CM | POA: Insufficient documentation

## 2014-01-23 LAB — CBC WITH DIFFERENTIAL/PLATELET
BASOS ABS: 0.1 10*3/uL (ref 0.0–0.1)
Basophils Relative: 1 % (ref 0–1)
EOS PCT: 2 % (ref 0–5)
Eosinophils Absolute: 0.2 10*3/uL (ref 0.0–0.7)
HEMATOCRIT: 36.3 % (ref 36.0–46.0)
Hemoglobin: 11.9 g/dL — ABNORMAL LOW (ref 12.0–15.0)
LYMPHS PCT: 36 % (ref 12–46)
Lymphs Abs: 2.6 10*3/uL (ref 0.7–4.0)
MCH: 28.5 pg (ref 26.0–34.0)
MCHC: 32.8 g/dL (ref 30.0–36.0)
MCV: 86.8 fL (ref 78.0–100.0)
MONO ABS: 0.6 10*3/uL (ref 0.1–1.0)
Monocytes Relative: 9 % (ref 3–12)
Neutro Abs: 3.8 10*3/uL (ref 1.7–7.7)
Neutrophils Relative %: 52 % (ref 43–77)
Platelets: 304 10*3/uL (ref 150–400)
RBC: 4.18 MIL/uL (ref 3.87–5.11)
RDW: 15.3 % (ref 11.5–15.5)
WBC: 7.2 10*3/uL (ref 4.0–10.5)

## 2014-01-23 LAB — PROTIME-INR
INR: 1.13 (ref 0.00–1.49)
Prothrombin Time: 14.3 seconds (ref 11.6–15.2)

## 2014-01-23 LAB — APTT: aPTT: 29 seconds (ref 24–37)

## 2014-01-23 MED ORDER — FENTANYL CITRATE 0.05 MG/ML IJ SOLN
INTRAMUSCULAR | Status: AC | PRN
  Administered 2014-01-23: 100 ug via INTRAVENOUS

## 2014-01-23 MED ORDER — SODIUM CHLORIDE 0.9 % IV SOLN
INTRAVENOUS | Status: DC
  Administered 2014-01-23: 13:00:00 via INTRAVENOUS

## 2014-01-23 MED ORDER — HEPARIN SOD (PORK) LOCK FLUSH 100 UNIT/ML IV SOLN
INTRAVENOUS | Status: AC
  Filled 2014-01-23: qty 5

## 2014-01-23 MED ORDER — LIDOCAINE-EPINEPHRINE (PF) 2 %-1:200000 IJ SOLN
INTRAMUSCULAR | Status: AC
  Filled 2014-01-23: qty 20

## 2014-01-23 MED ORDER — MIDAZOLAM HCL 2 MG/2ML IJ SOLN
INTRAMUSCULAR | Status: AC | PRN
  Administered 2014-01-23: 2 mg via INTRAVENOUS

## 2014-01-23 MED ORDER — MIDAZOLAM HCL 2 MG/2ML IJ SOLN
INTRAMUSCULAR | Status: AC
  Filled 2014-01-23: qty 4

## 2014-01-23 MED ORDER — HEPARIN SOD (PORK) LOCK FLUSH 100 UNIT/ML IV SOLN
500.0000 [IU] | Freq: Once | INTRAVENOUS | Status: AC
  Administered 2014-01-23: 500 [IU] via INTRAVENOUS

## 2014-01-23 MED ORDER — CEFAZOLIN SODIUM-DEXTROSE 2-3 GM-% IV SOLR
2.0000 g | Freq: Once | INTRAVENOUS | Status: AC
  Administered 2014-01-23: 2 g via INTRAVENOUS
  Filled 2014-01-23: qty 50

## 2014-01-23 MED ORDER — FENTANYL CITRATE 0.05 MG/ML IJ SOLN
INTRAMUSCULAR | Status: AC
  Filled 2014-01-23: qty 4

## 2014-01-23 NOTE — Discharge Instructions (Signed)
Implanted Port Home Guide °An implanted port is a type of central line that is placed under the skin. Central lines are used to provide IV access when treatment or nutrition needs to be given through a person's veins. Implanted ports are used for long-term IV access. An implanted port may be placed because:  °· You need IV medicine that would be irritating to the small veins in your hands or arms.   °· You need long-term IV medicines, such as antibiotics.   °· You need IV nutrition for a long period.   °· You need frequent blood draws for lab tests.   °· You need dialysis.   °Implanted ports are usually placed in the chest area, but they can also be placed in the upper arm, the abdomen, or the leg. An implanted port has two main parts:  °· Reservoir. The reservoir is round and will appear as a small, raised area under your skin. The reservoir is the part where a needle is inserted to give medicines or draw blood.   °· Catheter. The catheter is a thin, flexible tube that extends from the reservoir. The catheter is placed into a large vein. Medicine that is inserted into the reservoir goes into the catheter and then into the vein.   °HOW WILL I CARE FOR MY INCISION SITE? °Do not get the incision site wet. Bathe or shower as directed by your health care provider.  °HOW IS MY PORT ACCESSED? °Special steps must be taken to access the port:  °· Before the port is accessed, a numbing cream can be placed on the skin. This helps numb the skin over the port site.   °· Your health care provider uses a sterile technique to access the port. °· Your health care provider must put on a mask and sterile gloves. °· The skin over your port is cleaned carefully with an antiseptic and allowed to dry. °· The port is gently pinched between sterile gloves, and a needle is inserted into the port. °· Only "non-coring" port needles should be used to access the port. Once the port is accessed, a blood return should be checked. This helps  ensure that the port is in the vein and is not clogged.   °· If your port needs to remain accessed for a constant infusion, a clear (transparent) bandage will be placed over the needle site. The bandage and needle will need to be changed every week, or as directed by your health care provider.   °· Keep the bandage covering the needle clean and dry. Do not get it wet. Follow your health care provider's instructions on how to take a shower or bath while the port is accessed.   °· If your port does not need to stay accessed, no bandage is needed over the port.   °WHAT IS FLUSHING? °Flushing helps keep the port from getting clogged. Follow your health care provider's instructions on how and when to flush the port. Ports are usually flushed with saline solution or a medicine called heparin. The need for flushing will depend on how the port is used.  °· If the port is used for intermittent medicines or blood draws, the port will need to be flushed:   °· After medicines have been given.   °· After blood has been drawn.   °· As part of routine maintenance.   °· If a constant infusion is running, the port may not need to be flushed.   °HOW LONG WILL MY PORT STAY IMPLANTED? °The port can stay in for as long as your health care   provider thinks it is needed. When it is time for the port to come out, surgery will be done to remove it. The procedure is similar to the one performed when the port was put in.  WHEN SHOULD I SEEK IMMEDIATE MEDICAL CARE? When you have an implanted port, you should seek immediate medical care if:   You notice a bad smell coming from the incision site.   You have swelling, redness, or drainage at the incision site.   You have more swelling or pain at the port site or the surrounding area.   You have a fever that is not controlled with medicine. Document Released: 09/18/2005 Document Revised: 07/09/2013 Document Reviewed: 05/26/2013 [facility] Patient Information 2014 [place]. Implanted Port Insertion, Care After Refer to this sheet in the next few weeks. These instructions provide you with information on caring for yourself after your procedure. Your health care provider may also give you more specific instructions. Your treatment has been planned according to current medical practices, but problems sometimes occur. Call your health care provider if you have any problems or questions after your procedure. WHAT TO EXPECT AFTER THE PROCEDURE After your procedure, it is typical to have the following:   Discomfort at the port insertion site. Ice packs to the area will help.  Bruising on the skin over the port. This will subside in 3 4 days. HOME CARE INSTRUCTIONS  After your port is placed, you will get a manufacturer's information card. The card has information about your port. Keep this card with you at all times.   Know what kind of port you have. There are many types of ports available.   Wear a medical alert bracelet in case of an emergency. This can help alert health care workers that you have a port.   The port can stay in for as long as your health care provider believes it is necessary.   A home health care nurse may give medicines and take care of the port.   You or a family member can get special training and directions for giving medicine and taking care of the port at home.  SEEK MEDICAL CARE IF:  Your port does not flush or you are unable to get a blood return.   SEEK IMMEDIATE MEDICAL CARE IF:  You have new fluid or pus coming from your incision.   You notice a bad smell coming from your incision site.   You have swelling, pain, or more redness at the incision or port site.   You have a fever or chills.   You have chest pain or shortness of breath. Document Released: 07/09/2013 Document Reviewed: 05/26/2013 [facility] Patient Information 2014 [place], [place]. Moderate Sedation, Adult Moderate sedation is given to help you  relax or even sleep through a procedure. You may remain sleepy, be clumsy, or have poor balance for several hours following this procedure. Arrange for a responsible adult, family member, or friend to take you home. A responsible adult should stay with you for at least 24 hours or until the medicines have worn off.  Do not participate in any activities where you could become injured for the next 24 hours, or until you feel normal again. Do not:  Drive.  Swim.  Ride a bicycle.  Operate heavy machinery.  Cook.  Use power tools.  Climb ladders.  Work at [employer].  Do not make important decisions or sign legal documents until you are improved.  Vomiting may occur if you eat  too soon. When you can drink without vomiting, try water, juice, or soup. Try solid foods if you feel little or no nausea.  Only take over-the-counter or prescription medications for pain, discomfort, or fever as directed by your caregiver.If pain medications have been prescribed for you, ask your caregiver how soon it is safe to take them.  Make sure you and your family fully understands everything about the medication given to you. Make sure you understand what side effects may occur.  You should not drink alcohol, take sleeping pills, or medications that cause drowsiness for at least 24 hours.  If you smoke, do not smoke alone.  If you are feeling better, you may resume normal activities 24 hours after receiving sedation.  Keep all appointments as scheduled. Follow all instructions.  Ask questions if you do not understand. SEEK MEDICAL CARE IF:   Your skin is pale or bluish in color.  You continue to feel sick to your stomach (nauseous) or throw up (vomit).  Your pain is getting worse and not helped by medication.  You have bleeding or swelling.  You are still sleepy or feeling clumsy after 24 hours. SEEK IMMEDIATE MEDICAL CARE IF:   You develop a rash.  You have difficulty breathing.  You  develop any type of allergic problem.  You have a fever. Document Released: 06/13/2001 Document Revised: 12/11/2011 Document Reviewed: 05/26/2013 [facility] Patient Information 2014 [place].

## 2014-01-23 NOTE — Procedures (Signed)
Successful placement of right IJ approach port-a-cath with tip at the superior caval atrial junction. The catheter is ready for immediate use. No immediate post procedural complications.
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
## 2014-02-10 ENCOUNTER — Other Ambulatory Visit: Payer: Self-pay | Admitting: Nurse Practitioner

## 2014-03-04 ENCOUNTER — Telehealth: Payer: Self-pay | Admitting: Internal Medicine

## 2014-03-04 NOTE — Telephone Encounter (Signed)
Melissa will call patient and schedule for 6/9

## 2014-03-04 NOTE — Telephone Encounter (Signed)
New problem     Pt called and asked to be seen THIS WEEK, both her legs have been swelling off an on for the pass 3 weeks.  Dr Tressie Stalker at the cancer center, per pt staff called to make and appointment today.    Dr Tressie Stalker sujested see be seen by her Card.   Pt would like a call back please.

## 2014-03-06 ENCOUNTER — Ambulatory Visit (INDEPENDENT_AMBULATORY_CARE_PROVIDER_SITE_OTHER): Payer: Medicare Other | Admitting: Family Medicine

## 2014-03-06 ENCOUNTER — Encounter: Payer: Self-pay | Admitting: Family Medicine

## 2014-03-06 VITALS — BP 116/66 | HR 79 | Temp 99.9°F | Ht 65.0 in | Wt 167.8 lb

## 2014-03-06 DIAGNOSIS — L0291 Cutaneous abscess, unspecified: Secondary | ICD-10-CM

## 2014-03-06 DIAGNOSIS — L0390 Cellulitis, unspecified: Secondary | ICD-10-CM

## 2014-03-06 DIAGNOSIS — M7989 Other specified soft tissue disorders: Secondary | ICD-10-CM

## 2014-03-06 DIAGNOSIS — R609 Edema, unspecified: Secondary | ICD-10-CM

## 2014-03-06 LAB — POCT CBC
Granulocyte percent: 56.4 %G (ref 37–80)
HCT, POC: 32.6 % — AB (ref 37.7–47.9)
Hemoglobin: 10.5 g/dL — AB (ref 12.2–16.2)
Lymph, poc: 2.3 (ref 0.6–3.4)
MCH, POC: 28.5 pg (ref 27–31.2)
MCHC: 32.3 g/dL (ref 31.8–35.4)
MCV: 88.2 fL (ref 80–97)
MPV: 7.3 fL (ref 0–99.8)
POC Granulocyte: 3.7 (ref 2–6.9)
POC LYMPH PERCENT: 34.7 %L (ref 10–50)
Platelet Count, POC: 398 10*3/uL (ref 142–424)
RBC: 3.7 M/uL — AB (ref 4.04–5.48)
RDW, POC: 19.1 %
WBC: 6.5 10*3/uL (ref 4.6–10.2)

## 2014-03-06 MED ORDER — DOXYCYCLINE HYCLATE 100 MG PO TABS: 100.0000 mg | ORAL_TABLET | Freq: Two times a day (BID) | ORAL | Status: DC

## 2014-03-06 MED ORDER — NYSTATIN-TRIAMCINOLONE 100000-0.1 UNIT/GM-% EX OINT: 1.0000 "application " | TOPICAL_OINTMENT | Freq: Two times a day (BID) | CUTANEOUS | Status: AC

## 2014-03-06 MED ORDER — METOLAZONE 2.5 MG PO TABS: ORAL_TABLET | ORAL | Status: DC

## 2014-03-06 NOTE — Progress Notes (Signed)
   Subjective:    Patient ID: [name], female    DOB: October 09, 1930, 80 y.o.   MRN: [id number]  HPI This 80 y.o. female presents for evaluation of bilateral lower extremity edema.  She has some erythema in his left lower ankle.  She is seeing oncology for tx of lung cancer.  She has been taking Lasix and lasix isn't helping for the edema as well as it was.   Review of Systems C/o edema No chest pain, SOB, HA, dizziness, vision change, N/V, diarrhea, constipation, dysuria, urinary urgency or frequency, myalgias, arthralgias or rash.     Objective:   Physical Exam  Vital signs noted  Well developed well nourished female.  HEENT - Head atraumatic Normocephalic                Eyes - PERRLA, Conjuctiva - clear Sclera- Clear EOMI                Ears - EAC's Wnl TM's Wnl Gross Hearing WNL                Throat - oropharanx wnl Respiratory - Lungs CTA bilateral Cardiac - RRR S1 and S2 without murmur GI - Abdomen soft Nontender and bowel sounds active x 4 Extremities - Bilateral pretibial and pedal edema.  Left ankle with erythema      Assessment & Plan:  Cellulitis - Plan: doxycycline (VIBRA-TABS) 100 MG tablet  Edema - Plan: metolazone (ZAROXOLYN) 2.5 MG tablet one half prior to taking lasix dose and do this no longer than 3 days.  Wear medium knee high compression stockings bilateral during daytime, POCT CBC, BMP8+EGFR  Swelling of limb - Plan: metolazone (ZAROXOLYN) 2.5 MG tablet, nystatin-triamcinolone ointment (MYCOLOG), POCT CBC, BMP8+EGFR  Lysbeth Penner FNP

## 2014-03-07 ENCOUNTER — Other Ambulatory Visit: Payer: Self-pay | Admitting: Family Medicine

## 2014-03-07 LAB — BMP8+EGFR
BUN/Creatinine Ratio: 11 (ref 11–26)
BUN: 13 mg/dL (ref 8–27)
CO2: 28 mmol/L (ref 18–29)
Calcium: 8.8 mg/dL (ref 8.7–10.3)
Chloride: 99 mmol/L (ref 97–108)
Creatinine, Ser: 1.18 mg/dL — ABNORMAL HIGH (ref 0.57–1.00)
GFR calc Af Amer: 50 mL/min/{1.73_m2} — ABNORMAL LOW (ref >59)
GFR calc non Af Amer: 44 mL/min/{1.73_m2} — ABNORMAL LOW (ref >59)
Glucose: 91 mg/dL (ref 65–99)
Potassium: 4.3 mmol/L (ref 3.5–5.2)
Sodium: 141 mmol/L (ref 134–144)

## 2014-03-09 ENCOUNTER — Telehealth: Payer: Self-pay | Admitting: Family Medicine

## 2014-03-09 NOTE — Telephone Encounter (Signed)
Patient aware.

## 2014-03-10 ENCOUNTER — Encounter: Payer: Self-pay | Admitting: Internal Medicine

## 2014-03-10 ENCOUNTER — Other Ambulatory Visit: Payer: Self-pay | Admitting: Internal Medicine

## 2014-03-10 ENCOUNTER — Ambulatory Visit (INDEPENDENT_AMBULATORY_CARE_PROVIDER_SITE_OTHER): Payer: Medicare Other | Admitting: Internal Medicine

## 2014-03-10 VITALS — BP 136/86 | HR 88 | Ht 65.0 in | Wt 160.0 lb

## 2014-03-10 DIAGNOSIS — I4892 Unspecified atrial flutter: Secondary | ICD-10-CM

## 2014-03-10 DIAGNOSIS — I83893 Varicose veins of bilateral lower extremities with other complications: Secondary | ICD-10-CM

## 2014-03-10 DIAGNOSIS — I4891 Unspecified atrial fibrillation: Secondary | ICD-10-CM

## 2014-03-10 DIAGNOSIS — Z950 Presence of cardiac pacemaker: Secondary | ICD-10-CM

## 2014-03-10 LAB — MDC_IDC_ENUM_SESS_TYPE_INCLINIC
Battery Impedance: 250 Ohm
Battery Remaining Longevity: 84 mo
Battery Voltage: 2.79 V
Brady Statistic AS VP Percent: 1 %
Lead Channel Impedance Value: 436 Ohm
Lead Channel Pacing Threshold Amplitude: 0.75 V
Lead Channel Pacing Threshold Amplitude: 1.25 V
Lead Channel Sensing Intrinsic Amplitude: 2.8 mV
Lead Channel Setting Pacing Amplitude: 2.5 V
Lead Channel Setting Pacing Pulse Width: 0.4 ms
MDC IDC MSMT LEADCHNL RA PACING THRESHOLD PULSEWIDTH: 0.4 ms
MDC IDC MSMT LEADCHNL RV IMPEDANCE VALUE: 463 Ohm
MDC IDC MSMT LEADCHNL RV PACING THRESHOLD PULSEWIDTH: 0.4 ms
MDC IDC MSMT LEADCHNL RV SENSING INTR AMPL: 11.2 mV
MDC IDC SESS DTM: 20150609105625
MDC IDC SET LEADCHNL RA PACING AMPLITUDE: 2 V
MDC IDC SET LEADCHNL RV SENSING SENSITIVITY: 4 mV
MDC IDC STAT BRADY AP VP PERCENT: 93 %
MDC IDC STAT BRADY AP VS PERCENT: 2 %
MDC IDC STAT BRADY AS VS PERCENT: 4 %

## 2014-03-10 NOTE — Assessment & Plan Note (Signed)
She has some swelling in her legs with erythema. She is on medical treatment. I will ask the patient to reduce her sodium intake, reduce her fluid intake and keep her legs elevated.

## 2014-03-10 NOTE — Assessment & Plan Note (Signed)
She is maintaining NSR 99% of the time. She will continue her current meds.

## 2014-03-10 NOTE — Assessment & Plan Note (Signed)
Her Medtronic DDD PM is working normally. No evidence of damage with XRT. Will follow.
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
## 2014-03-10 NOTE — Patient Instructions (Signed)
Your physician recommends that you continue on your current medications as directed. Please refer to the Current Medication list given to you today.  Remote monitoring is used to monitor your Pacemaker  from home. This monitoring reduces the number of office visits required to check your device to one time per year. It allows [place] to keep an eye on the functioning of your device to ensure it is working properly. You are scheduled for a device check from home on  06/11/14. You may send your transmission at any time that day. If you have a wireless device, the transmission will be sent automatically. After your physician reviews your transmission, you will receive a postcard with your next transmission date.  Your physician wants you to follow-up in: 1 year with Dr.Taylor You will receive a reminder letter in the mail two months in advance. If you don't receive a letter, please call our office to schedule the follow-up appointment.  Keep your legs elevated at home

## 2014-03-11 ENCOUNTER — Ambulatory Visit: Payer: Medicare Other | Admitting: Family Medicine

## 2014-03-16 ENCOUNTER — Other Ambulatory Visit: Payer: Self-pay | Admitting: Family Medicine

## 2014-03-16 LAB — SPECIMEN STATUS REPORT

## 2014-03-17 ENCOUNTER — Encounter: Payer: Self-pay | Admitting: Internal Medicine

## 2014-03-17 LAB — IRON AND TIBC
Iron Saturation: 12 % — ABNORMAL LOW (ref 15–55)
Iron: 29 ug/dL — ABNORMAL LOW (ref 35–155)
TIBC: 240 ug/dL — ABNORMAL LOW (ref 250–450)
UIBC: 211 ug/dL (ref 150–375)

## 2014-03-17 LAB — B12 AND FOLATE PANEL
Folate: >19.9 ng/mL (ref >3.0)
Vitamin B-12: 568 pg/mL (ref 211–946)

## 2014-03-17 LAB — SPECIMEN STATUS REPORT

## 2014-03-17 LAB — FERRITIN: Ferritin: 227 ng/mL — ABNORMAL HIGH (ref 15–150)

## 2014-03-23 ENCOUNTER — Other Ambulatory Visit: Payer: Self-pay | Admitting: Family Medicine

## 2014-03-23 MED ORDER — FERROUS SULFATE 325 (65 FE) MG PO TBEC: DELAYED_RELEASE_TABLET | ORAL | Status: DC

## 2014-03-26 ENCOUNTER — Other Ambulatory Visit: Payer: Medicare Other

## 2014-03-26 DIAGNOSIS — Z1212 Encounter for screening for malignant neoplasm of rectum: Secondary | ICD-10-CM

## 2014-03-27 LAB — FECAL OCCULT BLOOD, IMMUNOCHEMICAL: Fecal Occult Bld: POSITIVE — AB

## 2014-03-30 ENCOUNTER — Ambulatory Visit: Payer: Medicare Other | Admitting: Family Medicine

## 2014-04-10 ENCOUNTER — Other Ambulatory Visit: Payer: Medicare Other

## 2014-04-10 DIAGNOSIS — Z1212 Encounter for screening for malignant neoplasm of rectum: Secondary | ICD-10-CM

## 2014-04-10 NOTE — Progress Notes (Signed)
Pt dropped off FOBT only 

## 2014-04-12 LAB — FECAL OCCULT BLOOD, IMMUNOCHEMICAL: FECAL OCCULT BLD: POSITIVE — AB

## 2014-04-14 ENCOUNTER — Telehealth: Payer: Self-pay | Admitting: Family Medicine

## 2014-04-14 ENCOUNTER — Other Ambulatory Visit (INDEPENDENT_AMBULATORY_CARE_PROVIDER_SITE_OTHER): Payer: Medicare Other

## 2014-04-14 DIAGNOSIS — K921 Melena: Secondary | ICD-10-CM

## 2014-04-14 NOTE — Telephone Encounter (Signed)
Patient concerned because past 2 hemoccult cards have been positive. She will come by in the next couple days to have CBC done. Explained that she may need a GI referral to determine cause and treatment. Since she is undergoing cancer treatment she didn't think any other interventions could be started. Explained that this would be up to the GI and oncologist.

## 2014-04-14 NOTE — Progress Notes (Signed)
Pt came in for lab  only 

## 2014-04-15 LAB — ANEMIA PROFILE B
Basophils Absolute: 0 10*3/uL (ref 0.0–0.2)
Basos: 1 %
EOS: 2 %
Eosinophils Absolute: 0.1 10*3/uL (ref 0.0–0.4)
Ferritin: 177 ng/mL — ABNORMAL HIGH (ref 15–150)
Folate: >19.9 ng/mL (ref >3.0)
HCT: 36 % (ref 34.0–46.6)
HEMOGLOBIN: 11.8 g/dL (ref 11.1–15.9)
IMMATURE GRANS (ABS): 0 10*3/uL (ref 0.0–0.1)
Immature Granulocytes: 0 %
Iron Saturation: 14 % — ABNORMAL LOW (ref 15–55)
Iron: 32 ug/dL — ABNORMAL LOW (ref 35–155)
Lymphocytes Absolute: 1.2 10*3/uL (ref 0.7–3.1)
Lymphs: 21 %
MCH: 29.6 pg (ref 26.6–33.0)
MCHC: 32.8 g/dL (ref 31.5–35.7)
MCV: 90 fL (ref 79–97)
MONOCYTES: 13 %
Monocytes Absolute: 0.8 10*3/uL (ref 0.1–0.9)
NEUTROS PCT: 63 %
Neutrophils Absolute: 3.8 10*3/uL (ref 1.4–7.0)
Platelets: 376 10*3/uL (ref 150–379)
RBC: 3.99 x10E6/uL (ref 3.77–5.28)
RDW: 17.7 % — ABNORMAL HIGH (ref 12.3–15.4)
RETIC CT PCT: 2.1 % (ref 0.6–2.6)
TIBC: 228 ug/dL — ABNORMAL LOW (ref 250–450)
UIBC: 196 ug/dL (ref 150–375)
VITAMIN B 12: 471 pg/mL (ref 211–946)
WBC: 5.9 10*3/uL (ref 3.4–10.8)

## 2014-04-16 NOTE — Telephone Encounter (Signed)
Pt came in for blood work and requested samples of Zetia and Xerelto.  Zetai 10mg  lot #[id number] exp 10/17 #35    merck Xerelto 20mg  lot #[id number] exp 9/17 #45 [name]

## 2014-05-11 ENCOUNTER — Telehealth: Payer: Self-pay | Admitting: Internal Medicine

## 2014-05-11 NOTE — Telephone Encounter (Signed)
New problem    Pt stated she went to pick up her Xarelto and it cost a lot more because it need a prior authorization. Please call pt concerning this matter.

## 2014-05-11 NOTE — Telephone Encounter (Signed)
Contacted pt back, and she called to state that she cannot afford to take her Xarelto as prescribed by Dr Lovena Le.  Pt states that the med will be $147 co pay. Reinforced the importance of this med to the pt and stated I will obtain samples for her to pick up.  Got 2 weeks worth of samples Xarelto 20 mg, and obtained a 30 day free trial card for pt.  Informed pt of this and provided education on how to sign the 30 day card up.  Instructed pt to contact her pharmacy and gave her our fax # [phone], and told her to tell the pharmacist to fax a prior auth to our office so we can have this med approved for pt.  Pt states she will send her Niece to the office to pick up samples and card on 8/12. Pt verbalized understanding and very gracious for all the help provided.  Will forward this message to Dr Lovena Le and nurse for further review.

## 2014-05-16 ENCOUNTER — Other Ambulatory Visit: Payer: Self-pay | Admitting: Internal Medicine

## 2014-05-29 ENCOUNTER — Other Ambulatory Visit: Payer: Self-pay | Admitting: Family Medicine

## 2014-06-01 ENCOUNTER — Other Ambulatory Visit: Payer: Self-pay | Admitting: *Deleted

## 2014-06-01 NOTE — Telephone Encounter (Signed)
Last ov 03/06/14. I don't see this med on pt med list in Epic. See allergies. Please print if approved.

## 2014-06-03 ENCOUNTER — Other Ambulatory Visit: Payer: Self-pay | Admitting: Family Medicine

## 2014-06-04 NOTE — Telephone Encounter (Signed)
Called pt to tell her Rx for Ultram was ready for her to pickup and pt does not won't Rx. Said her oncologist took her off med. Rx removed from narc box and placed in shredder. Witnessed by Dorothey Baseman.

## 2014-06-11 ENCOUNTER — Ambulatory Visit (INDEPENDENT_AMBULATORY_CARE_PROVIDER_SITE_OTHER): Payer: Medicare Other | Admitting: *Deleted

## 2014-06-11 ENCOUNTER — Encounter: Payer: Self-pay | Admitting: Internal Medicine

## 2014-06-11 DIAGNOSIS — I495 Sick sinus syndrome: Secondary | ICD-10-CM

## 2014-06-11 LAB — MDC_IDC_ENUM_SESS_TYPE_REMOTE
Battery Impedance: 299 Ohm
Battery Remaining Longevity: 80 mo
Brady Statistic AP VP Percent: 97 %
Brady Statistic AS VS Percent: 1 %
Date Time Interrogation Session: 20150910114611
Lead Channel Impedance Value: 501 Ohm
Lead Channel Pacing Threshold Pulse Width: 0.4 ms
Lead Channel Setting Pacing Amplitude: 2.5 V
Lead Channel Setting Pacing Pulse Width: 0.4 ms
Lead Channel Setting Sensing Sensitivity: 4 mV
MDC IDC MSMT BATTERY VOLTAGE: 2.78 V
MDC IDC MSMT LEADCHNL RA IMPEDANCE VALUE: 413 Ohm
MDC IDC MSMT LEADCHNL RA PACING THRESHOLD AMPLITUDE: 0.875 V
MDC IDC MSMT LEADCHNL RA PACING THRESHOLD PULSEWIDTH: 0.4 ms
MDC IDC MSMT LEADCHNL RV PACING THRESHOLD AMPLITUDE: 0.875 V
MDC IDC SET LEADCHNL RA PACING AMPLITUDE: 2 V
MDC IDC STAT BRADY AP VS PERCENT: 2 %
MDC IDC STAT BRADY AS VP PERCENT: 0 %

## 2014-06-11 NOTE — Progress Notes (Signed)
Remote pacemaker transmission.   

## 2014-06-15 ENCOUNTER — Other Ambulatory Visit: Payer: Self-pay | Admitting: Internal Medicine

## 2014-06-15 ENCOUNTER — Ambulatory Visit (INDEPENDENT_AMBULATORY_CARE_PROVIDER_SITE_OTHER): Payer: Medicare Other | Admitting: Family Medicine

## 2014-06-15 VITALS — BP 110/67 | HR 92 | Temp 99.1°F | Ht 65.0 in | Wt 155.0 lb

## 2014-06-15 DIAGNOSIS — L02419 Cutaneous abscess of limb, unspecified: Secondary | ICD-10-CM

## 2014-06-15 DIAGNOSIS — L03115 Cellulitis of right lower limb: Secondary | ICD-10-CM

## 2014-06-15 DIAGNOSIS — L03119 Cellulitis of unspecified part of limb: Secondary | ICD-10-CM

## 2014-06-15 MED ORDER — DOXYCYCLINE HYCLATE 100 MG PO TABS: 100.0000 mg | ORAL_TABLET | Freq: Two times a day (BID) | ORAL | Status: DC

## 2014-06-15 NOTE — Progress Notes (Signed)
   Subjective:    Patient ID: [name], female    DOB: [birth year], 80 y.o.   MRN: [id number]  HPI This 80 y.o. female presents for evaluation of non healing sore on right lateral foot.   Review of Systems No chest pain, SOB, HA, dizziness, vision change, N/V, diarrhea, constipation, dysuria, urinary urgency or frequency, myalgias, arthralgias or rash.     Objective:   Physical Exam Vital signs noted  Well developed well nourished female.  HEENT - Head atraumatic Normocephalic Respiratory - Lungs CTA bilateral Cardiac - RRR S1 and S2 without murmur GI - Abdomen soft Nontender and bowel sounds active x 4 Extremities - bilateral legs with pre-tibial and pedal edema.  Right lateral malleolus with Dime sized healing wound w/o drainage.      Assessment & Plan:  Cellulitis of right lower extremity - Plan: doxycycline (VIBRA-TABS) 100 MG tablet X 2 weeks.  Edema - Take lasix 20mg  po qd x 3 days to lessen swelling.  Lysbeth Penner FNP

## 2014-06-22 ENCOUNTER — Ambulatory Visit (INDEPENDENT_AMBULATORY_CARE_PROVIDER_SITE_OTHER): Payer: Medicare Other | Admitting: Family Medicine

## 2014-06-22 VITALS — BP 137/79 | HR 84 | Temp 98.0°F | Wt 154.0 lb

## 2014-06-22 DIAGNOSIS — I10 Essential (primary) hypertension: Secondary | ICD-10-CM

## 2014-06-22 DIAGNOSIS — C3490 Malignant neoplasm of unspecified part of unspecified bronchus or lung: Secondary | ICD-10-CM

## 2014-06-22 NOTE — Progress Notes (Signed)
   Subjective:    Patient ID: [name], female    DOB: 1931/09/18, 80 y.o.   MRN: [id number]  HPI  Patient is here for follow up appointment. She has hx of hypertension, hyperlipidemia, and GERD. She has cellulitis and a wound on her left lateral malleolus.  She has been taking doxycycline bid for the last week and the wound is healing better.  She is seeing oncology for lung cancer and she is going to start chemotherapy.   Review of Systems    No chest pain, SOB, HA, dizziness, vision change, N/V, diarrhea, constipation, dysuria, urinary urgency or frequency, myalgias, arthralgias or rash.  Objective:   Physical Exam  Vital signs noted  Well developed well nourished female.  HEENT - Head atraumatic Normocephalic                Eyes - PERRLA, Conjuctiva - clear Sclera- Clear EOMI                Ears - EAC's Wnl TM's Wnl Gross Hearing WNL                Throat - oropharanx wnl Respiratory - Lungs CTA bilateral Cardiac - RRR S1 and S2 without murmur GI - Abdomen soft Nontender and bowel sounds active x 4 Extremities - No edema. Neuro - Grossly intact. Skin - Right malleolus with wound that is just about healed.     Assessment & Plan:  HYPERTENSION, UNSPECIFIED - Controlled  Squamous cell lung cancer, unspecified laterality - Follow up with Heme/Onc  Cellulitis - Discussed with patient that the wound on her right lateral malleolus is healing well.  Edema - Ok to wear venous compression stockings.  Lysbeth Penner FNP

## 2014-06-23 ENCOUNTER — Encounter: Payer: Self-pay | Admitting: Cardiology

## 2014-06-25 ENCOUNTER — Encounter: Payer: Self-pay | Admitting: Cardiology

## 2014-06-29 ENCOUNTER — Other Ambulatory Visit: Payer: Self-pay | Admitting: Nurse Practitioner

## 2014-07-01 ENCOUNTER — Other Ambulatory Visit: Payer: Self-pay | Admitting: Family Medicine

## 2014-07-02 ENCOUNTER — Ambulatory Visit (INDEPENDENT_AMBULATORY_CARE_PROVIDER_SITE_OTHER): Payer: Medicare Other

## 2014-07-02 DIAGNOSIS — Z23 Encounter for immunization: Secondary | ICD-10-CM

## 2014-07-06 ENCOUNTER — Encounter: Payer: Self-pay | Admitting: Family Medicine

## 2014-07-06 ENCOUNTER — Ambulatory Visit (INDEPENDENT_AMBULATORY_CARE_PROVIDER_SITE_OTHER): Payer: Medicare Other | Admitting: Family Medicine

## 2014-07-06 ENCOUNTER — Ambulatory Visit (INDEPENDENT_AMBULATORY_CARE_PROVIDER_SITE_OTHER): Payer: Medicare Other

## 2014-07-06 ENCOUNTER — Telehealth: Payer: Self-pay | Admitting: Family Medicine

## 2014-07-06 VITALS — BP 118/73 | HR 70 | Temp 100.2°F | Ht 65.0 in | Wt 154.0 lb

## 2014-07-06 DIAGNOSIS — R0601 Orthopnea: Secondary | ICD-10-CM

## 2014-07-06 DIAGNOSIS — R05 Cough: Secondary | ICD-10-CM

## 2014-07-06 DIAGNOSIS — R059 Cough, unspecified: Secondary | ICD-10-CM

## 2014-07-06 DIAGNOSIS — C3490 Malignant neoplasm of unspecified part of unspecified bronchus or lung: Secondary | ICD-10-CM

## 2014-07-06 DIAGNOSIS — R509 Fever, unspecified: Secondary | ICD-10-CM

## 2014-07-06 DIAGNOSIS — R0602 Shortness of breath: Secondary | ICD-10-CM

## 2014-07-06 LAB — POCT CBC
Granulocyte percent: 82.3 %G — AB (ref 37–80)
HCT, POC: 40.5 % (ref 37.7–47.9)
HEMOGLOBIN: 12.8 g/dL (ref 12.2–16.2)
Lymph, poc: 1.1 (ref 0.6–3.4)
MCH: 28.8 pg (ref 27–31.2)
MCHC: 31.6 g/dL — AB (ref 31.8–35.4)
MCV: 91.4 fL (ref 80–97)
MPV: 7.6 fL (ref 0–99.8)
POC Granulocyte: 6.2 (ref 2–6.9)
POC LYMPH PERCENT: 14.1 %L (ref 10–50)
Platelet Count, POC: 374 10*3/uL (ref 142–424)
RBC: 4.4 M/uL (ref 4.04–5.48)
RDW, POC: 16.2 %
WBC: 7.5 10*3/uL (ref 4.6–10.2)

## 2014-07-06 NOTE — Progress Notes (Signed)
Subjective:    Patient ID: [name], female    DOB: [birth year], 80 y.o.   MRN: [id number]  HPI Patient here today for cough and wheezing for about 4-5 days. She has been so short of breath that she's had to sit up to breathe at nighttime . The sputum has been yellow green brown tinged and had some blood in it.          Patient Active Problem List   Diagnosis Date Noted  . Squamous cell lung cancer 01/08/2014  . Pneumothorax 12/23/2013  . Pneumothorax after biopsy 12/23/2013  . Lung mass 12/03/2013  . Need for prophylactic vaccination against Streptococcus pneumoniae (pneumococcus) 09/12/2013  . Community acquired pneumonia 08/03/2013  . Sepsis 08/03/2013  . Acute respiratory failure with hypoxia 08/03/2013  . Sinusitis nasal 06/13/2013  . Pain in limb 05/20/2013  . Peripheral vascular disease, unspecified 05/20/2013  . Atrial flutter with rapid ventricular response 05/09/2013  . Chronic anticoagulation 05/09/2013  . HLD (hyperlipidemia) 03/11/2013  . Varicose veins of lower extremities with other complications 93/23/5573  . Venous insufficiency 05/20/2012  . Nevus, non-neoplastic 05/20/2012  . HYPERTENSION, UNSPECIFIED 02/09/2009  . Atrial fibrillation 02/09/2009  . ATRIAL FLUTTER, PAROXYSMAL 02/09/2009  . BRADYCARDIA-TACHYCARDIA SYNDROME 02/09/2009  . BACK PAIN, CHRONIC 02/09/2009  . PPM-[employer]tr[employer] 02/09/2009   Outpatient Encounter Prescriptions as of 07/06/2014  Medication Sig  . acetaminophen (TYLENOL) 500 MG tablet Take 500 mg by mouth.  Marland Kitchen albuterol (PROVENTIL HFA;VENTOLIN HFA) 108 (90 BASE) MCG/ACT inhaler Inhale 2 puffs into the lungs every 6 (six) hours as needed for wheezing or shortness of breath.  Marland Kitchen amiodarone (PACERONE) 200 MG tablet TAKE  (1)  TABLET TWICE A DAY.  . Calcium 500-125 MG-UNIT TABS Take 1 tablet by mouth.  . cephALEXin (KEFLEX) 500 MG capsule Take 500 mg by mouth.  . Cholecalciferol (VITAMIN D) 2000 UNITS CAPS Take 2,000 Units by mouth  at bedtime.  Marland Kitchen dexamethasone (DECADRON) 4 MG tablet Take 4 mg by mouth.  . esomeprazole (NEXIUM) 40 MG capsule Take 40 mg by mouth daily.   Marland Kitchen ezetimibe (ZETIA) 10 MG tablet Take 10 mg by mouth at bedtime.  . fish oil-omega-3 fatty acids 1000 MG capsule Take 1,000 mg by mouth 3 (three) times daily.   . fluticasone (FLONASE) 50 MCG/ACT nasal spray Place 1 spray into both nostrils daily as needed (congestion).  . furosemide (LASIX) 20 MG tablet Take 80 mg by mouth.  Marland Kitchen HYDROmorphone (DILAUDID) 2 MG tablet Take 2-4 mg by mouth every 3 (three) hours as needed for severe pain.  Marland Kitchen lidocaine-prilocaine (EMLA) cream Apply topically.  . metoprolol tartrate (LOPRESSOR) 25 MG tablet TAKE (1) TABLET THREE TIMES DAILY.  . Multiple Vitamin (MULTIVITAMIN WITH MINERALS) TABS tablet Take 1 tablet by mouth every evening.   . nystatin-triamcinolone ointment (MYCOLOG) Apply 1 application topically 2 (two) times daily.  . prochlorperazine (COMPAZINE) 10 MG tablet Take 10 mg by mouth.  . Rivaroxaban (XARELTO) 20 MG TABS tablet Take 1 tablet (20 mg total) by mouth daily with supper.  . solifenacin (VESICARE) 5 MG tablet Take 10 mg by mouth.  . traZODone (DESYREL) 50 MG tablet TAKE 1/2 TABLET AT BEDTIME  . verapamil (CALAN-SR) 120 MG CR tablet Take 1 tablet (120 mg total) by mouth 2 (two) times daily.  . vitamin C (ASCORBIC ACID) 500 MG tablet Take 500 mg by mouth at bedtime.   . [DISCONTINUED] doxycycline (VIBRA-TABS) 100 MG tablet Take 100 mg by mouth.  . [DISCONTINUED]  metolazone (ZAROXOLYN) 2.5 MG tablet Take 30 minutes prior to lasix in am prn for 3 days prn for severe swelling  . doxycycline (VIBRA-TABS) 100 MG tablet Take 100 mg by mouth daily.  . [DISCONTINUED] doxycycline (VIBRA-TABS) 100 MG tablet Take 1 tablet (100 mg total) by mouth 2 (two) times daily.  . [DISCONTINUED] ferrous sulfate 325 (65 FE) MG EC tablet Take one po qd  . [DISCONTINUED] potassium chloride SA (KLOR-CON M20) 20 MEQ tablet Take 20 mEq  by mouth.  . [DISCONTINUED] pravastatin (PRAVACHOL) 40 MG tablet Take 40 mg by mouth at bedtime.  . [DISCONTINUED] traMADol (ULTRAM) 50 MG tablet TAKE  (1)  TABLET  EVERY EIGHT HOURS AS NEEDED.    Review of Systems  Constitutional: Positive for fever.  HENT: Positive for congestion.   Eyes: Negative.   Respiratory: Positive for cough, shortness of breath and wheezing.   Cardiovascular: Negative.   Gastrointestinal: Negative.   Endocrine: Negative.   Genitourinary: Negative.   Musculoskeletal: Negative.   Skin: Negative.   Allergic/Immunologic: Negative.   Neurological: Negative.   Hematological: Negative.   Psychiatric/Behavioral: Negative.        Objective:   Physical Exam  Constitutional: She is oriented to person, place, and time. She appears distressed.  HENT:  Head: Normocephalic and atraumatic.  Right Ear: External ear normal.  Left Ear: External ear normal.  Nose: Nose normal.  Mouth/Throat: Oropharynx is clear and moist. No oropharyngeal exudate.  Eyes: Conjunctivae and EOM are normal. Pupils are equal, round, and reactive to light. Right eye exhibits no discharge.  Neck: Normal range of motion. Neck supple. No JVD present. No thyromegaly present.  Cardiovascular: Normal rate and regular rhythm.  Exam reveals no gallop and no friction rub.   No murmur heard. At 96 per minute  Pulmonary/Chest: She is in respiratory distress. She has no wheezes. She has rales.  Rales anteriorly in the left chest and anteriorly in the left chest posteriorly  Musculoskeletal: Normal range of motion. She exhibits no edema.  Neurological: She is alert and oriented to person, place, and time.  Skin: Skin is warm and dry. No rash noted. No erythema. There is pallor.  Psychiatric: She has a normal mood and affect. Her behavior is normal. Thought content normal.   BP 118/73  Pulse 70  Temp(Src) 100.2 F (37.9 C) (Oral)  Ht $R'5\' 5"'Cadiz$  (1.651 m)  Wt 154 lb (69.854 kg)  BMI 25.63  kg/m2  Pulse ox is 83%  The chest x-ray is in the record. A left pleural effusion with an enlarging area of left upper lobe opacity  Results for orders placed in visit on 07/06/14  POCT CBC      Result Value Ref Range   WBC 7.5  4.6 - 10.2 K/uL   Lymph, poc 1.1  0.6 - 3.4   POC LYMPH PERCENT 14.1  10 - 50 %L   MID (cbc)    0 - 0.9   POC MID %    0 - 12 %M   POC Granulocyte 6.2  2 - 6.9   Granulocyte percent 82.3 (*) 37 - 80 %G   RBC 4.4  4.04 - 5.48 M/uL   Hemoglobin 12.8  12.2 - 16.2 g/dL   HCT, POC 40.5  37.7 - 47.9 %   MCV 91.4  80 - 97 fL   MCH, POC 28.8  27 - 31.2 pg   MCHC 31.6 (*) 31.8 - 35.4 g/dL   RDW, POC 16.2  Platelet Count, POC 374.0  142 - 424 K/uL   MPV 7.6  0 - 99.8 fL         Assessment & Plan:  1. Cough - POCT CBC - DG Chest 2 View; Future - BMP8+EGFR  2. Fever, unspecified fever cause - POCT CBC - DG Chest 2 View; Future - BMP8+EGFR  3. Squamous cell lung cancer, unspecified laterality  4. Shortness of breath  5. Sleeps in sitting position due to orthopnea  Patient Instructions  We will arrange for you admitted to [facility] for further treatment Please take the copies of your CBC and chest x-ray report Also request a consult with the oncologist   Arrie Senate MD

## 2014-07-06 NOTE — Patient Instructions (Signed)
We will arrange for you admitted to [facility] for further treatment Please take the copies of your CBC and chest x-ray report Also request a consult with the oncologist

## 2014-07-06 NOTE — Telephone Encounter (Signed)
appt given for 2:45 with Laurance Flatten

## 2014-07-07 ENCOUNTER — Telehealth: Payer: Self-pay | Admitting: Family Medicine

## 2014-07-07 LAB — BMP8+EGFR
BUN / CREAT RATIO: 14 (ref 11–26)
BUN: 13 mg/dL (ref 8–27)
CO2: 26 mmol/L (ref 18–29)
CREATININE: 0.96 mg/dL (ref 0.57–1.00)
Calcium: 9.5 mg/dL (ref 8.7–10.3)
Chloride: 99 mmol/L (ref 97–108)
GFR calc Af Amer: 65 mL/min/{1.73_m2} (ref >59)
GFR calc non Af Amer: 56 mL/min/{1.73_m2} — ABNORMAL LOW (ref >59)
Glucose: 99 mg/dL (ref 65–99)
Potassium: 4.3 mmol/L (ref 3.5–5.2)
SODIUM: 139 mmol/L (ref 134–144)

## 2014-07-07 NOTE — Telephone Encounter (Signed)
Message copied by Waverly Ferrari on Tue Jul 07, 2014 10:58 AM ------      Message from: Chipper Herb      Created: Tue Jul 07, 2014  7:27 AM       The blood sugar is good at 99. The creatinine is in fact improved and lower than it was 4 months ago. The electrolytes including potassium are within normal limits------ this patient has been admitted to [facility] because of shortness of breath and left pleural effusion------ please call the patient is results to her husband and if possible send a copy to Dr. Zollie Beckers at [facility] ------

## 2014-07-14 ENCOUNTER — Other Ambulatory Visit (HOSPITAL_COMMUNITY): Payer: Self-pay

## 2014-07-14 ENCOUNTER — Ambulatory Visit (HOSPITAL_COMMUNITY)
Admission: AD | Admit: 2014-07-14 | Discharge: 2014-07-14 | Disposition: A | Discharge status: Home / Self Care | Payer: Medicare Other | Source: Other Acute Inpatient Hospital | Attending: Internal Medicine | Admitting: Internal Medicine

## 2014-07-14 ENCOUNTER — Inpatient Hospital Stay
Admission: AD | Admit: 2014-07-14 | Discharge: 2014-08-02 | Disposition: E | Payer: Self-pay | Source: Ambulatory Visit | Attending: Internal Medicine | Admitting: Internal Medicine

## 2014-07-14 DIAGNOSIS — J189 Pneumonia, unspecified organism: Secondary | ICD-10-CM | POA: Insufficient documentation

## 2014-07-14 DIAGNOSIS — J80 Acute respiratory distress syndrome: Secondary | ICD-10-CM

## 2014-07-14 DIAGNOSIS — R34 Anuria and oliguria: Secondary | ICD-10-CM

## 2014-07-14 LAB — IRON AND TIBC
Iron: 22 ug/dL — ABNORMAL LOW (ref 42–135)
Saturation Ratios: 12 % — ABNORMAL LOW (ref 20–55)
TIBC: 185 ug/dL — ABNORMAL LOW (ref 250–470)
UIBC: 163 ug/dL (ref 125–400)

## 2014-07-14 LAB — T4, FREE: FREE T4: 1.43 ng/dL (ref 0.80–1.80)

## 2014-07-14 LAB — BLOOD GAS, ARTERIAL
ACID-BASE EXCESS: 5.7 mmol/L — AB (ref 0.0–2.0)
BICARBONATE: 29.8 meq/L — AB (ref 20.0–24.0)
Expiratory PAP: 8
FIO2: 0.6 %
Inspiratory PAP: 15
MODE: POSITIVE
O2 SAT: 94.2 %
PATIENT TEMPERATURE: 98.6
PEEP: 8 cmH2O
TCO2: 31.1 mmol/L (ref 0–100)
pCO2 arterial: 44.2 mmHg (ref 35.0–45.0)
pH, Arterial: 7.443 (ref 7.350–7.450)
pO2, Arterial: 73.4 mmHg — ABNORMAL LOW (ref 80.0–100.0)

## 2014-07-14 LAB — MAGNESIUM: Magnesium: 2.6 mg/dL — ABNORMAL HIGH (ref 1.5–2.5)

## 2014-07-14 LAB — CBC WITH DIFFERENTIAL/PLATELET
Basophils Absolute: 0 10*3/uL (ref 0.0–0.1)
Basophils Relative: 0 % (ref 0–1)
EOS ABS: 0 10*3/uL (ref 0.0–0.7)
EOS PCT: 0 % (ref 0–5)
HCT: 31.4 % — ABNORMAL LOW (ref 36.0–46.0)
HEMOGLOBIN: 10.2 g/dL — AB (ref 12.0–15.0)
LYMPHS ABS: 0.4 10*3/uL — AB (ref 0.7–4.0)
LYMPHS PCT: 3 % — AB (ref 12–46)
MCH: 28.3 pg (ref 26.0–34.0)
MCHC: 32.5 g/dL (ref 30.0–36.0)
MCV: 87.2 fL (ref 78.0–100.0)
MONOS PCT: 8 % (ref 3–12)
Monocytes Absolute: 1 10*3/uL (ref 0.1–1.0)
NEUTROS PCT: 89 % — AB (ref 43–77)
Neutro Abs: 11.3 10*3/uL — ABNORMAL HIGH (ref 1.7–7.7)
PLATELETS: 632 10*3/uL — AB (ref 150–400)
RBC: 3.6 MIL/uL — AB (ref 3.87–5.11)
RDW: 15.5 % (ref 11.5–15.5)
WBC: 12.6 10*3/uL — AB (ref 4.0–10.5)

## 2014-07-14 LAB — COMPREHENSIVE METABOLIC PANEL
ALT: 14 U/L (ref 0–35)
ANION GAP: 11 (ref 5–15)
AST: 18 U/L (ref 0–37)
Albumin: 2.1 g/dL — ABNORMAL LOW (ref 3.5–5.2)
Alkaline Phosphatase: 112 U/L (ref 39–117)
BUN: 39 mg/dL — AB (ref 6–23)
CO2: 30 meq/L (ref 19–32)
Calcium: 10 mg/dL (ref 8.4–10.5)
Chloride: 101 mEq/L (ref 96–112)
Creatinine, Ser: 1.22 mg/dL — ABNORMAL HIGH (ref 0.50–1.10)
GFR calc non Af Amer: 41 mL/min — ABNORMAL LOW (ref >90)
GFR, EST AFRICAN AMERICAN: 47 mL/min — AB (ref >90)
GLUCOSE: 101 mg/dL — AB (ref 70–99)
POTASSIUM: 4.7 meq/L (ref 3.7–5.3)
Sodium: 142 mEq/L (ref 137–147)
TOTAL PROTEIN: 6.5 g/dL (ref 6.0–8.3)
Total Bilirubin: 0.5 mg/dL (ref 0.3–1.2)

## 2014-07-14 LAB — FERRITIN: Ferritin: 371 ng/mL — ABNORMAL HIGH (ref 10–291)

## 2014-07-14 LAB — PHOSPHORUS: Phosphorus: 3.3 mg/dL (ref 2.3–4.6)

## 2014-07-14 LAB — TSH: TSH: 1.66 u[IU]/mL (ref 0.350–4.500)

## 2014-07-14 LAB — HEMOGLOBIN A1C
Hgb A1c MFr Bld: 5.8 % — ABNORMAL HIGH (ref <5.7)
Mean Plasma Glucose: 120 mg/dL — ABNORMAL HIGH (ref <117)

## 2014-07-14 LAB — PROTIME-INR
INR: 3.6 — ABNORMAL HIGH (ref 0.00–1.49)
Prothrombin Time: 35.9 seconds — ABNORMAL HIGH (ref 11.6–15.2)

## 2014-07-14 LAB — VITAMIN B12: VITAMIN B 12: 675 pg/mL (ref 211–911)

## 2014-07-15 LAB — BASIC METABOLIC PANEL
Anion gap: 10 (ref 5–15)
BUN: 38 mg/dL — ABNORMAL HIGH (ref 6–23)
CHLORIDE: 104 meq/L (ref 96–112)
CO2: 30 meq/L (ref 19–32)
CREATININE: 1.22 mg/dL — AB (ref 0.50–1.10)
Calcium: 9.6 mg/dL (ref 8.4–10.5)
GFR calc Af Amer: 47 mL/min — ABNORMAL LOW (ref >90)
GFR calc non Af Amer: 41 mL/min — ABNORMAL LOW (ref >90)
Glucose, Bld: 111 mg/dL — ABNORMAL HIGH (ref 70–99)
Potassium: 4.8 mEq/L (ref 3.7–5.3)
Sodium: 144 mEq/L (ref 137–147)

## 2014-07-15 LAB — BLOOD GAS, ARTERIAL
Acid-Base Excess: 6 mmol/L — ABNORMAL HIGH (ref 0.0–2.0)
Bicarbonate: 30 mEq/L — ABNORMAL HIGH (ref 20.0–24.0)
FIO2: 0.9 %
O2 CONTENT: 40 L/min
O2 Saturation: 93.3 %
PO2 ART: 69.9 mmHg — AB (ref 80.0–100.0)
Patient temperature: 98.6
TCO2: 31.3 mmol/L (ref 0–100)
pCO2 arterial: 43.8 mmHg (ref 35.0–45.0)
pH, Arterial: 7.451 — ABNORMAL HIGH (ref 7.350–7.450)

## 2014-07-15 LAB — CBC WITH DIFFERENTIAL/PLATELET
Basophils Absolute: 0 10*3/uL (ref 0.0–0.1)
Basophils Relative: 0 % (ref 0–1)
EOS PCT: 0 % (ref 0–5)
Eosinophils Absolute: 0 10*3/uL (ref 0.0–0.7)
HCT: 29.3 % — ABNORMAL LOW (ref 36.0–46.0)
HEMOGLOBIN: 9.5 g/dL — AB (ref 12.0–15.0)
LYMPHS PCT: 3 % — AB (ref 12–46)
Lymphs Abs: 0.4 10*3/uL — ABNORMAL LOW (ref 0.7–4.0)
MCH: 29.1 pg (ref 26.0–34.0)
MCHC: 32.4 g/dL (ref 30.0–36.0)
MCV: 89.6 fL (ref 78.0–100.0)
Monocytes Absolute: 0.5 10*3/uL (ref 0.1–1.0)
Monocytes Relative: 5 % (ref 3–12)
NEUTROS ABS: 9.8 10*3/uL — AB (ref 1.7–7.7)
Neutrophils Relative %: 92 % — ABNORMAL HIGH (ref 43–77)
Platelets: 571 10*3/uL — ABNORMAL HIGH (ref 150–400)
RBC: 3.27 MIL/uL — ABNORMAL LOW (ref 3.87–5.11)
RDW: 15.4 % (ref 11.5–15.5)
WBC: 10.6 10*3/uL — ABNORMAL HIGH (ref 4.0–10.5)

## 2014-07-15 LAB — PRO B NATRIURETIC PEPTIDE: Pro B Natriuretic peptide (BNP): 2565 pg/mL — ABNORMAL HIGH (ref 0–450)

## 2014-07-15 LAB — FOLATE RBC: RBC FOLATE: 989 ng/mL — AB (ref >280)

## 2014-07-16 LAB — BLOOD GAS, ARTERIAL
Acid-Base Excess: 7.9 mmol/L — ABNORMAL HIGH (ref 0.0–2.0)
BICARBONATE: 31.6 meq/L — AB (ref 20.0–24.0)
Delivery systems: POSITIVE
Expiratory PAP: 6
FIO2: 1 %
Inspiratory PAP: 12
O2 Saturation: 90.8 %
PCO2 ART: 41.9 mmHg (ref 35.0–45.0)
PH ART: 7.489 — AB (ref 7.350–7.450)
Patient temperature: 98.6
TCO2: 32.9 mmol/L (ref 0–100)
pO2, Arterial: 58.4 mmHg — ABNORMAL LOW (ref 80.0–100.0)

## 2014-07-16 LAB — VITAMIN D 1,25 DIHYDROXY
VITAMIN D 1, 25 (OH) TOTAL: 33 pg/mL (ref 18–72)
VITAMIN D3 1, 25 (OH): 33 pg/mL
Vitamin D2 1, 25 (OH)2: <8 pg/mL

## 2014-07-16 LAB — PROCALCITONIN: Procalcitonin: 0.11 ng/mL

## 2014-07-17 ENCOUNTER — Other Ambulatory Visit (HOSPITAL_COMMUNITY): Payer: Self-pay

## 2014-07-17 LAB — BASIC METABOLIC PANEL
Anion gap: 11 (ref 5–15)
BUN: 23 mg/dL (ref 6–23)
CALCIUM: 9.1 mg/dL (ref 8.4–10.5)
CO2: 37 mEq/L — ABNORMAL HIGH (ref 19–32)
CREATININE: 0.96 mg/dL (ref 0.50–1.10)
Chloride: 101 mEq/L (ref 96–112)
GFR calc Af Amer: 63 mL/min — ABNORMAL LOW (ref >90)
GFR calc non Af Amer: 54 mL/min — ABNORMAL LOW (ref >90)
GLUCOSE: 143 mg/dL — AB (ref 70–99)
Potassium: 2.5 mEq/L — CL (ref 3.7–5.3)
Sodium: 149 mEq/L — ABNORMAL HIGH (ref 137–147)

## 2014-07-17 LAB — CBC
HEMATOCRIT: 32.2 % — AB (ref 36.0–46.0)
Hemoglobin: 10.3 g/dL — ABNORMAL LOW (ref 12.0–15.0)
MCH: 28.7 pg (ref 26.0–34.0)
MCHC: 32 g/dL (ref 30.0–36.0)
MCV: 89.7 fL (ref 78.0–100.0)
Platelets: 512 10*3/uL — ABNORMAL HIGH (ref 150–400)
RBC: 3.59 MIL/uL — ABNORMAL LOW (ref 3.87–5.11)
RDW: 15.5 % (ref 11.5–15.5)
WBC: 20.2 10*3/uL — ABNORMAL HIGH (ref 4.0–10.5)

## 2014-07-17 LAB — PRO B NATRIURETIC PEPTIDE: Pro B Natriuretic peptide (BNP): 6229 pg/mL — ABNORMAL HIGH (ref 0–450)

## 2014-07-17 LAB — POTASSIUM: Potassium: 2.4 mEq/L — CL (ref 3.7–5.3)

## 2014-07-18 LAB — POTASSIUM: Potassium: 3.7 mEq/L (ref 3.7–5.3)

## 2014-07-18 LAB — PROCALCITONIN: Procalcitonin: 0.15 ng/mL

## 2014-07-19 LAB — CBC
HCT: 35.2 % — ABNORMAL LOW (ref 36.0–46.0)
Hemoglobin: 11.4 g/dL — ABNORMAL LOW (ref 12.0–15.0)
MCH: 29.4 pg (ref 26.0–34.0)
MCHC: 32.4 g/dL (ref 30.0–36.0)
MCV: 90.7 fL (ref 78.0–100.0)
PLATELETS: 419 10*3/uL — AB (ref 150–400)
RBC: 3.88 MIL/uL (ref 3.87–5.11)
RDW: 16.2 % — AB (ref 11.5–15.5)
WBC: 23.5 10*3/uL — AB (ref 4.0–10.5)

## 2014-07-19 LAB — BASIC METABOLIC PANEL
Anion gap: 11 (ref 5–15)
BUN: 28 mg/dL — ABNORMAL HIGH (ref 6–23)
CALCIUM: 9.5 mg/dL (ref 8.4–10.5)
CO2: 31 mEq/L (ref 19–32)
CREATININE: 0.94 mg/dL (ref 0.50–1.10)
Chloride: 105 mEq/L (ref 96–112)
GFR calc Af Amer: 65 mL/min — ABNORMAL LOW (ref >90)
GFR, EST NON AFRICAN AMERICAN: 56 mL/min — AB (ref >90)
Glucose, Bld: 130 mg/dL — ABNORMAL HIGH (ref 70–99)
Potassium: 4.1 mEq/L (ref 3.7–5.3)
SODIUM: 147 meq/L (ref 137–147)

## 2014-07-19 LAB — PRO B NATRIURETIC PEPTIDE: Pro B Natriuretic peptide (BNP): 7598 pg/mL — ABNORMAL HIGH (ref 0–450)

## 2014-07-20 ENCOUNTER — Other Ambulatory Visit (HOSPITAL_COMMUNITY): Payer: Medicare Other

## 2014-07-20 ENCOUNTER — Other Ambulatory Visit (HOSPITAL_COMMUNITY): Payer: Self-pay

## 2014-07-20 LAB — CBC
HCT: 38.3 % (ref 36.0–46.0)
HEMOGLOBIN: 12 g/dL (ref 12.0–15.0)
MCH: 28.5 pg (ref 26.0–34.0)
MCHC: 31.3 g/dL (ref 30.0–36.0)
MCV: 91 fL (ref 78.0–100.0)
PLATELETS: 402 10*3/uL — AB (ref 150–400)
RBC: 4.21 MIL/uL (ref 3.87–5.11)
RDW: 16.4 % — ABNORMAL HIGH (ref 11.5–15.5)
WBC: 21.7 10*3/uL — ABNORMAL HIGH (ref 4.0–10.5)

## 2014-07-20 LAB — BASIC METABOLIC PANEL
ANION GAP: 11 (ref 5–15)
BUN: 40 mg/dL — ABNORMAL HIGH (ref 6–23)
CALCIUM: 9.8 mg/dL (ref 8.4–10.5)
CHLORIDE: 105 meq/L (ref 96–112)
CO2: 34 meq/L — AB (ref 19–32)
CREATININE: 1.36 mg/dL — AB (ref 0.50–1.10)
GFR calc non Af Amer: 36 mL/min — ABNORMAL LOW (ref >90)
GFR, EST AFRICAN AMERICAN: 41 mL/min — AB (ref >90)
Glucose, Bld: 165 mg/dL — ABNORMAL HIGH (ref 70–99)
Potassium: 3.6 mEq/L — ABNORMAL LOW (ref 3.7–5.3)
SODIUM: 150 meq/L — AB (ref 137–147)

## 2014-07-20 LAB — VANCOMYCIN, TROUGH: Vancomycin Tr: 21.7 ug/mL — ABNORMAL HIGH (ref 10.0–20.0)

## 2014-07-21 LAB — BASIC METABOLIC PANEL
Anion gap: 16 — ABNORMAL HIGH (ref 5–15)
BUN: 54 mg/dL — ABNORMAL HIGH (ref 6–23)
CALCIUM: 9.9 mg/dL (ref 8.4–10.5)
CHLORIDE: 106 meq/L (ref 96–112)
CO2: 27 meq/L (ref 19–32)
CREATININE: 1.67 mg/dL — AB (ref 0.50–1.10)
GFR calc non Af Amer: 28 mL/min — ABNORMAL LOW (ref >90)
GFR, EST AFRICAN AMERICAN: 32 mL/min — AB (ref >90)
Glucose, Bld: 116 mg/dL — ABNORMAL HIGH (ref 70–99)
Potassium: 4.2 mEq/L (ref 3.7–5.3)
Sodium: 149 mEq/L — ABNORMAL HIGH (ref 137–147)

## 2014-08-02 DEATH — deceased

## 2014-09-10 ENCOUNTER — Encounter (HOSPITAL_COMMUNITY): Payer: Self-pay | Admitting: Internal Medicine

## 2014-10-12 IMAGING — CR DG CHEST 2V
2 series · 2 of 2 positions shown · non-contrast
Comparison: 05/09/2013

CLINICAL DATA: Fever. Shortness of breath. Right chest and shoulder
pain.

EXAM:
CHEST  2 VIEW

[view not recorded (1 of 2)]
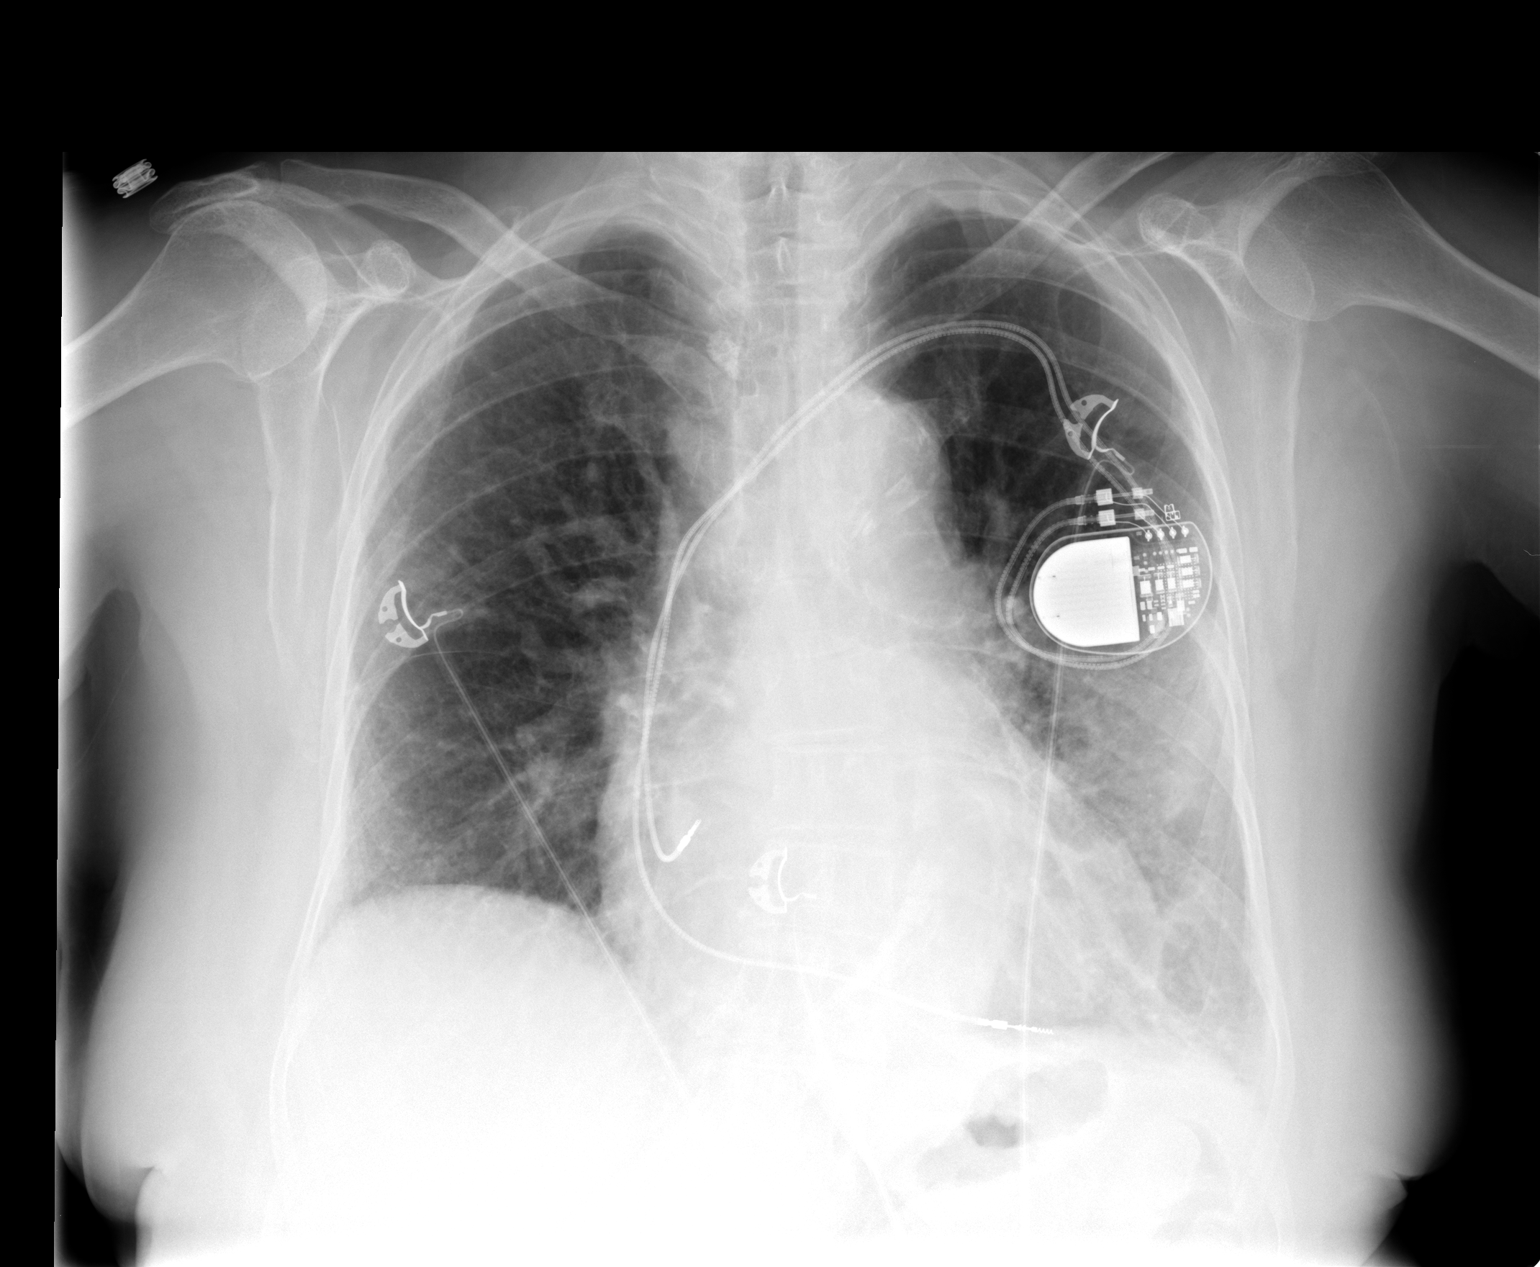

[view not recorded (2 of 2)]
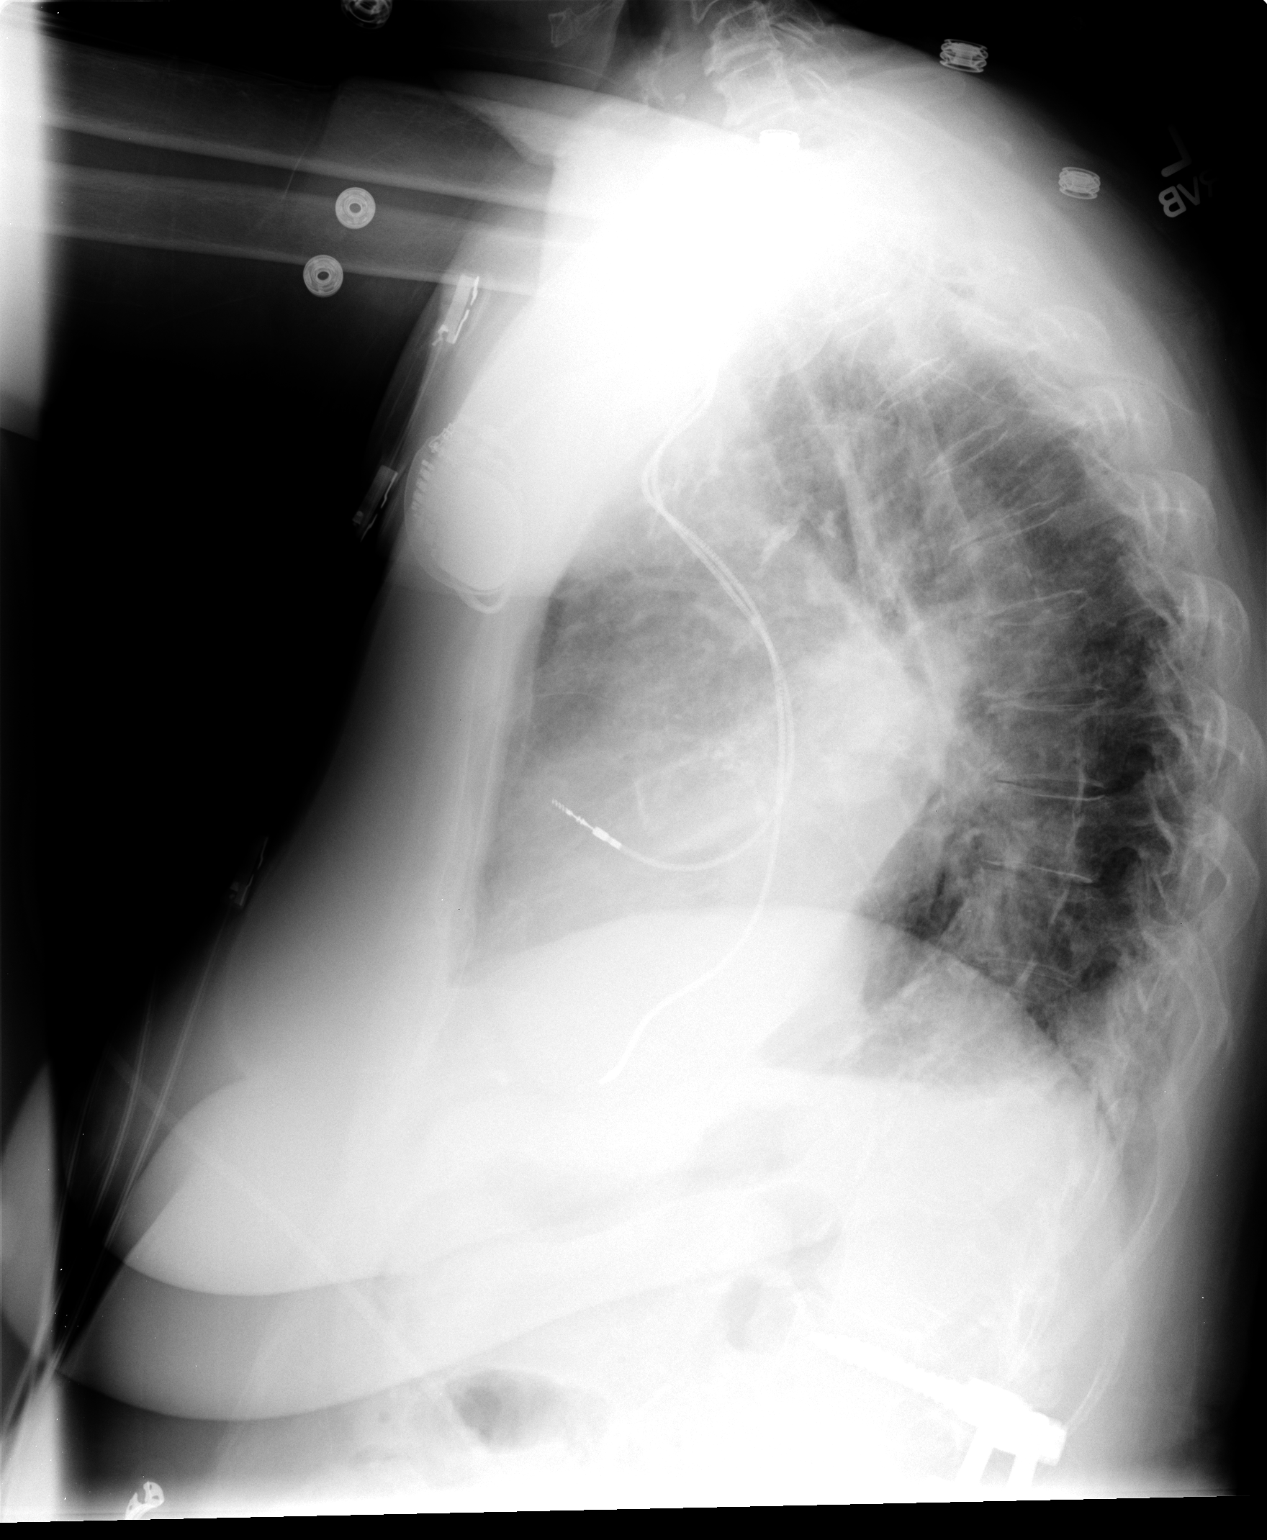

[2 of 2 positions shown; findings below may reference images not displayed]

FINDINGS: Mild cardiomegaly and pulmonary vascular congestion are stable. Dual
lead transvenous pacemaker remains in appropriate position.

Increased opacity is seen in the region of the lingula which is most
pronounced on the lateral projection, suspicious for pneumonia. No
evidence of pleural effusion.
IMPRESSION: Increased lingular opacity, suspicious for pneumonia. Recommend
short-term radiographic followup in several weeks to confirm
resolution.

Stable cardiomegaly and pulmonary vascular congestion.

## 2015-03-09 IMAGING — CR DG CHEST 2V
2 series · 2 of 2 positions shown · non-contrast
Comparison: DG CHEST 1V PORT dated 12/24/2013;

CLINICAL DATA: Left lung mass and small pneumothorax following
bronchoscopy.

EXAM:
CHEST - 2 VIEW

[view not recorded (1 of 2)]
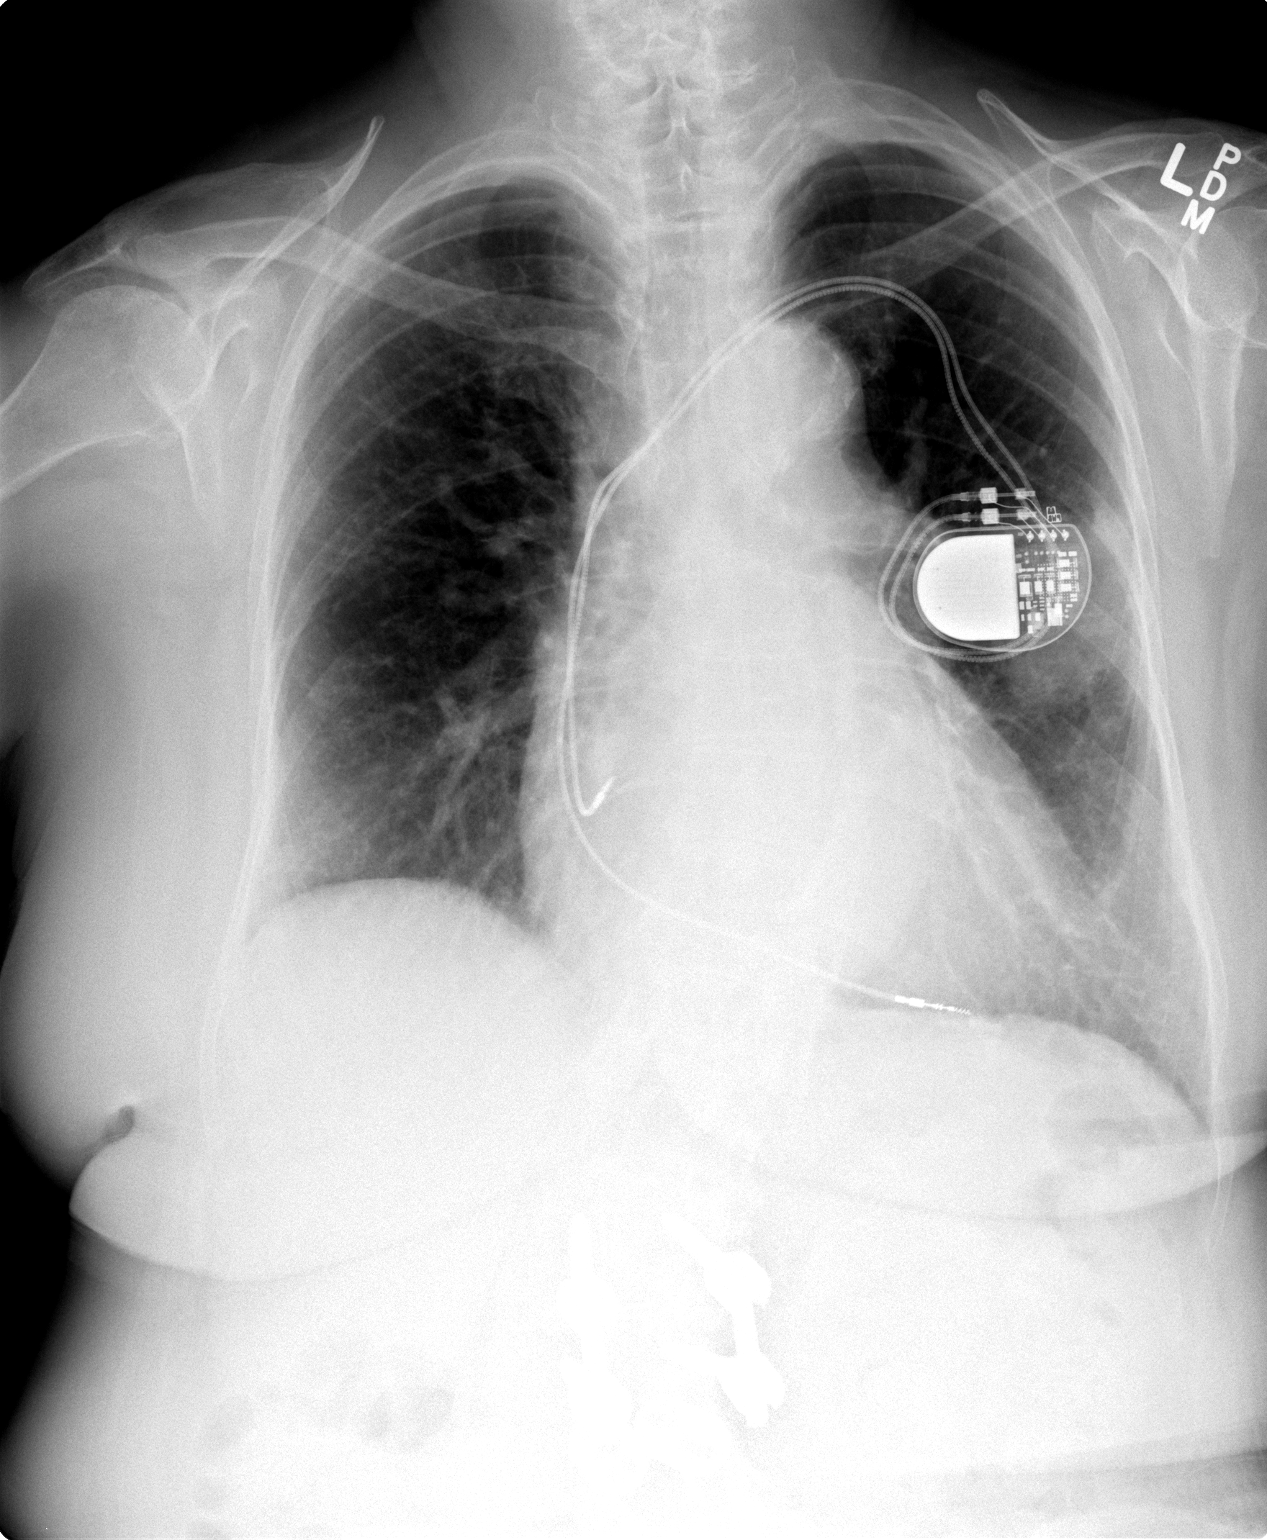

[view not recorded (2 of 2)]
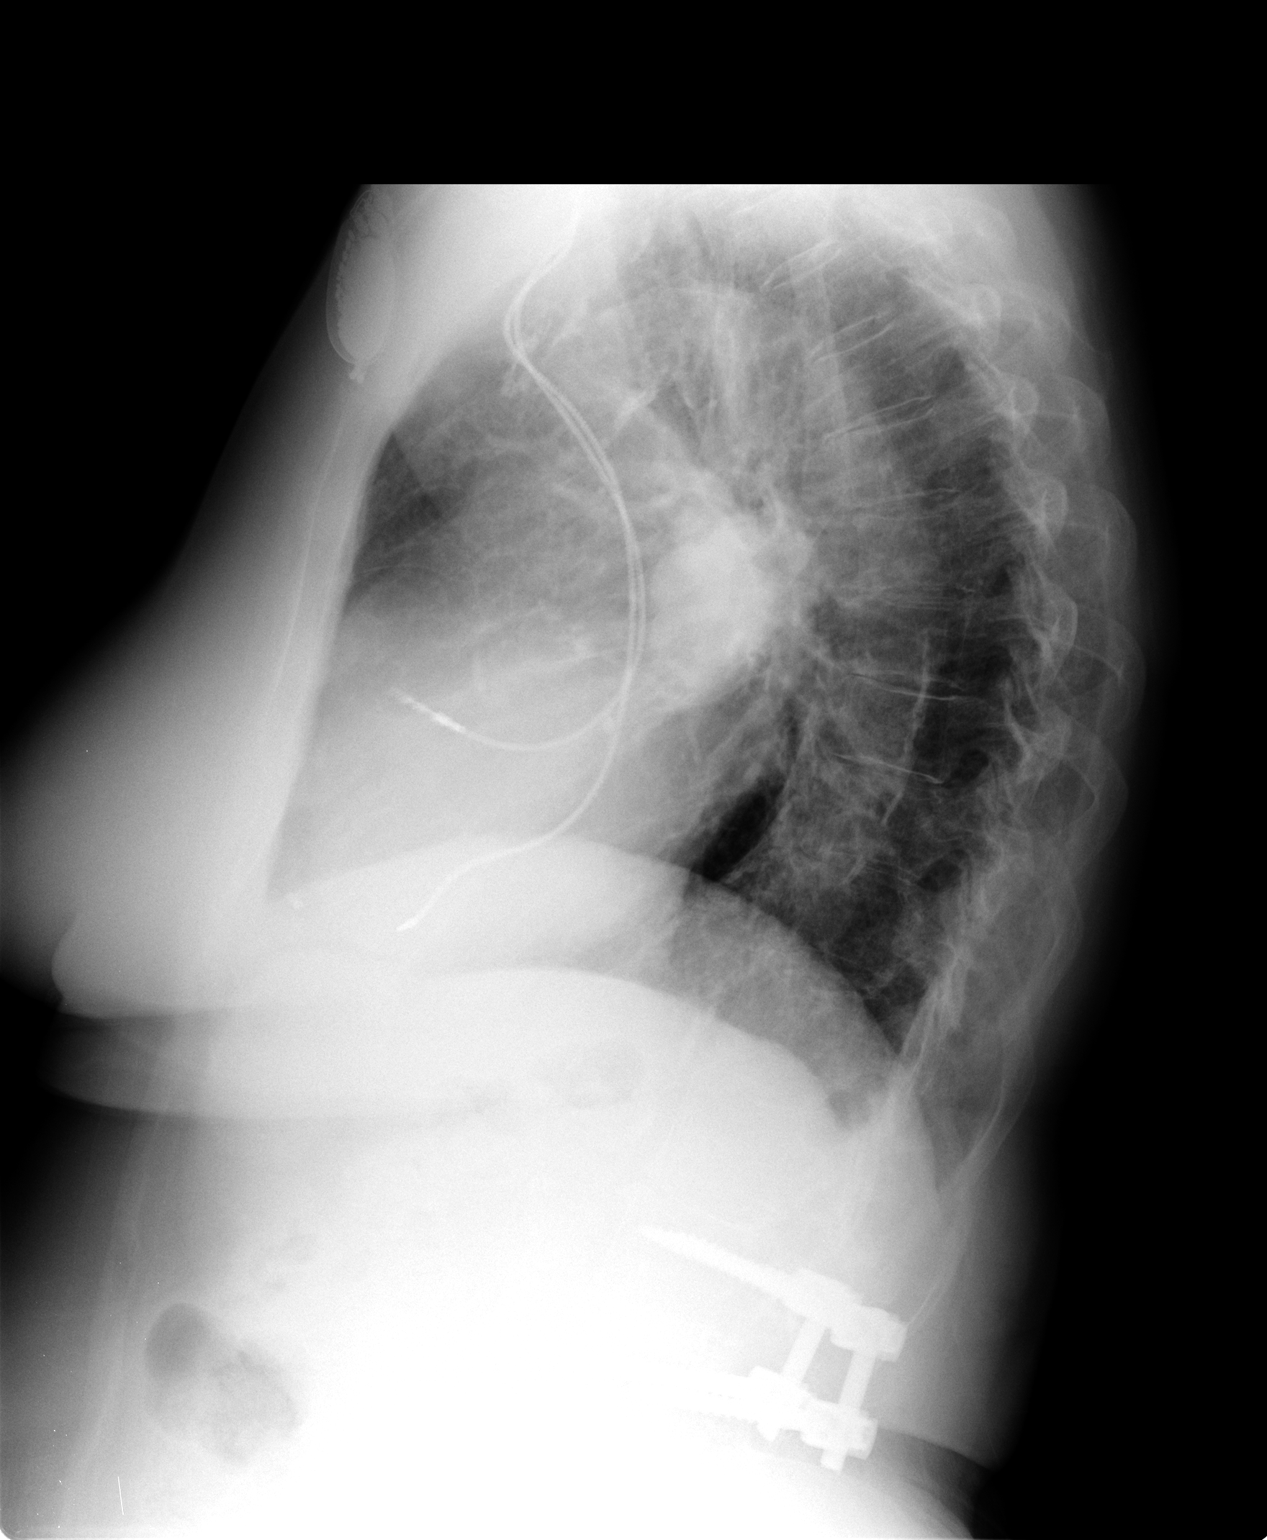

[2 of 2 positions shown; findings below may reference images not displayed]

DG CHEST 1V PORT
dated 12/23/2013; NM PET IMAGE INITIAL (PI) SKULL BASE TO THIGH dated
12/15/2013
FINDINGS: Left pneumothorax has completely resolved. Lobulated mass again
identified in the mid left chest which is partially obscured by a
pacemaker generator. The dual-chamber pacemaker and lead shows
stable positioning. There is stable cardiomegaly. No pulmonary edema
or pleural fluid is identified. Stable degenerative changes are
present in the thoracic spine.
IMPRESSION: Resolution of left pneumothorax. Stable radiographic appearance of
left lung mass.

## 2015-09-25 IMAGING — CR DG CHEST 1V PORT
1 series · 1 of 1 positions shown · non-contrast
Comparison: 07/14/2014

CLINICAL DATA: ARDS

EXAM:
PORTABLE CHEST - 1 VIEW

[portable]
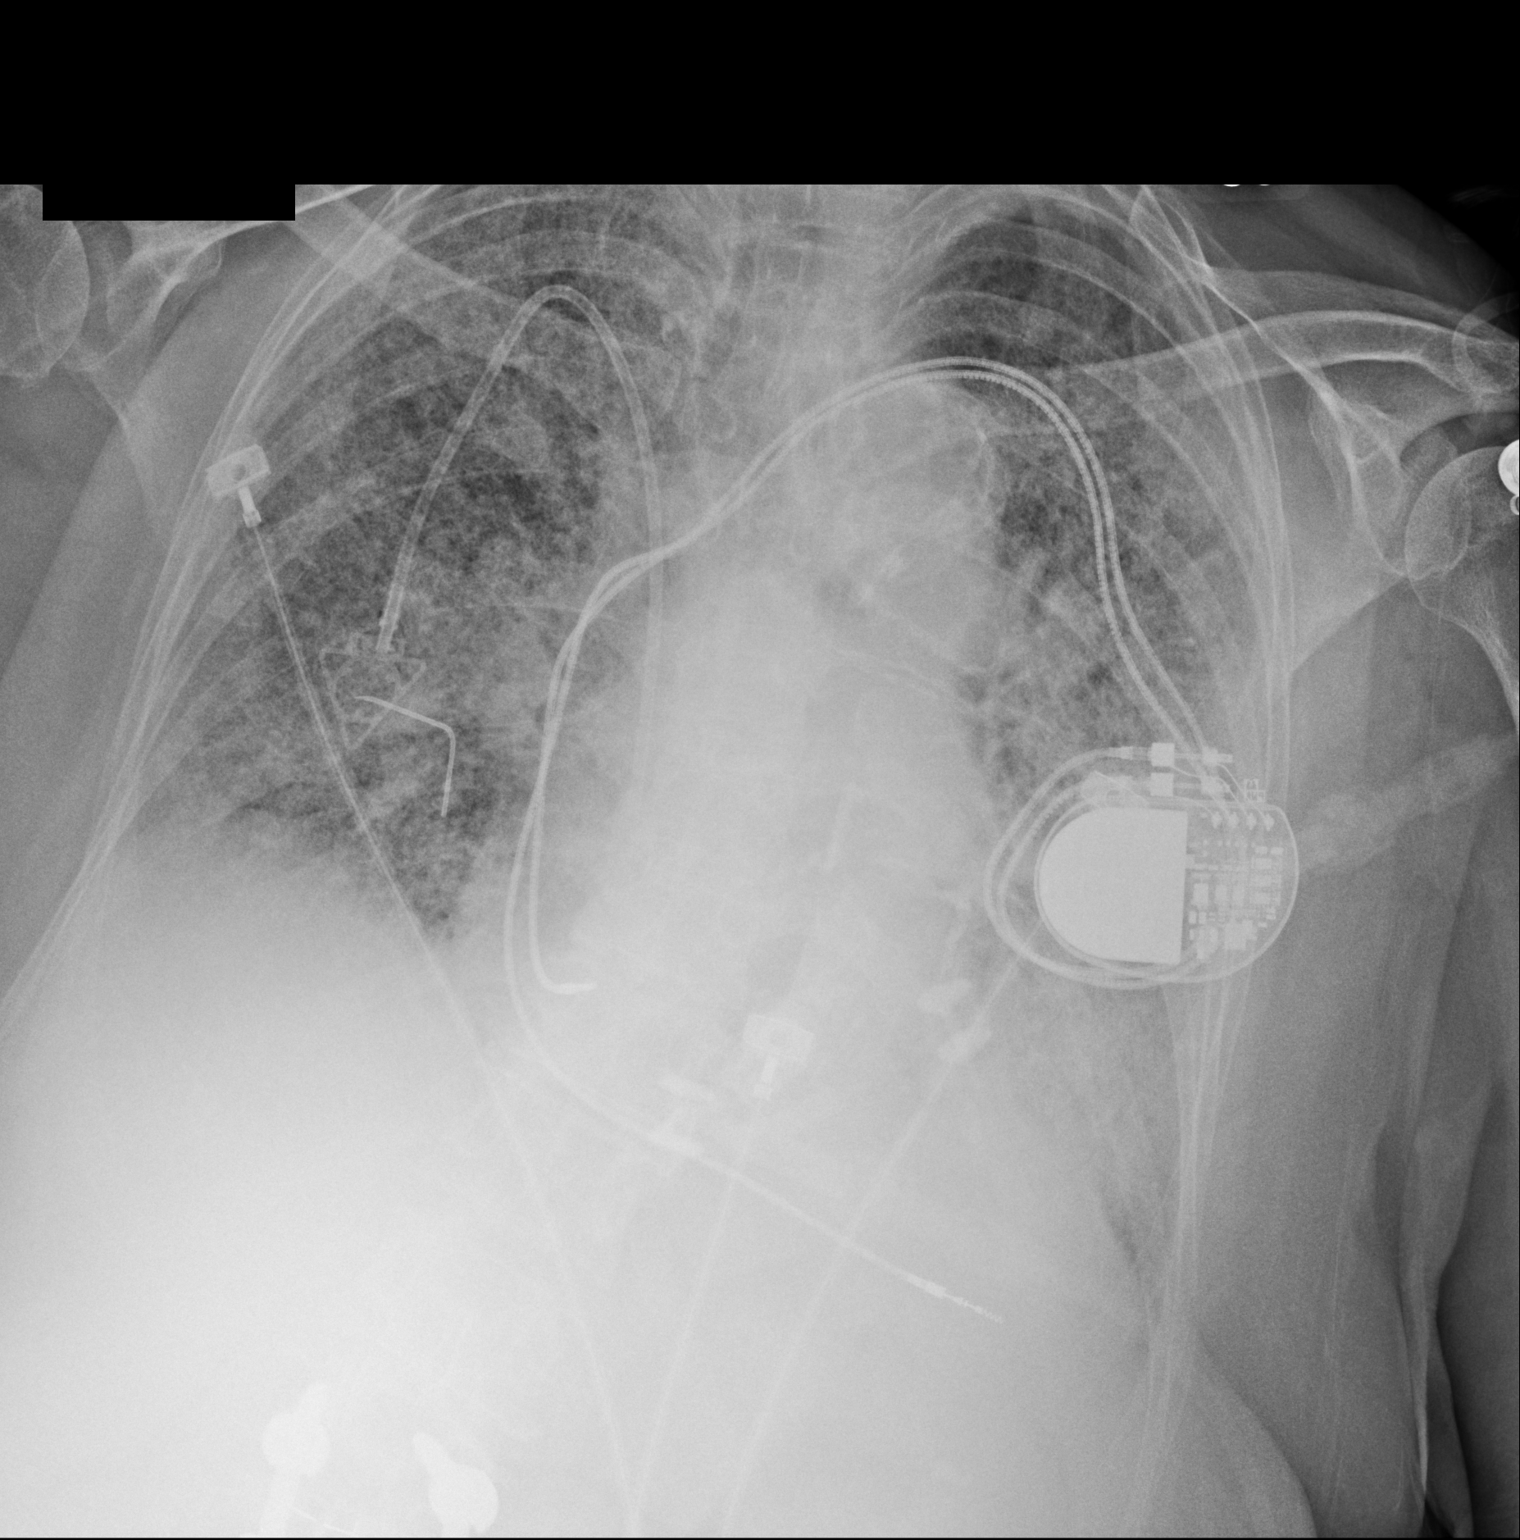

[1 of 1 positions shown; findings below may reference images not displayed]

FINDINGS: Progression of diffuse bilateral airspace disease especially on the
right.

Port-A-Cath tip in the SVC unchanged. Dual lead pacemaker unchanged.
Cardiac enlargement.
IMPRESSION: Progression of diffuse bilateral airspace disease especially on the
right. This is consistent with ARDS. Superimposed pneumonia cannot
be excluded.
